# Patient Record
Sex: Female | Born: 1985 | Hispanic: Yes | Marital: Married | Smoking: Never smoker
Health system: Southern US, Community
[De-identification: ages and names within clinical notes are randomized; demographics above are authoritative.]

## PROBLEM LIST (undated history)

## (undated) ENCOUNTER — Inpatient Hospital Stay (HOSPITAL_COMMUNITY): Payer: Self-pay

## (undated) DIAGNOSIS — R519 Headache, unspecified: Secondary | ICD-10-CM

## (undated) DIAGNOSIS — O24419 Gestational diabetes mellitus in pregnancy, unspecified control: Secondary | ICD-10-CM

## (undated) DIAGNOSIS — R51 Headache: Secondary | ICD-10-CM

## (undated) HISTORY — PX: NO PAST SURGERIES: SHX2092

## (undated) HISTORY — DX: Gestational diabetes mellitus in pregnancy, unspecified control: O24.419

---

## 2012-05-23 NOTE — L&D Delivery Note (Signed)
Delivery Note At 3:23 PM a viable and healthy female was delivered via Vaginal, Spontaneous Delivery (Presentation: asynclitic rotated to Right Occiput Anterior, ).  Shoulders deliver easily.  APGAR: 8, 9; weight pending.    Placenta status: Intact, Spontaneous.  Cord: 3 vessels with the following complications: None.  Cord pH: n/a  Anesthesia: Epidural  Episiotomy: None Lacerations: 2nd degree; digital exam of rectum > intact.   Suture Repair: 3.0 vicryl rapide Est. Blood Loss (mL): 200  Mom to postpartum.  Baby to Couplet care / Skin to Skin.  [facility] 05/22/2013, 3:57 PM

## 2013-02-14 LAB — OB RESULTS CONSOLE RUBELLA ANTIBODY, IGM: Rubella: IMMUNE

## 2013-02-14 LAB — OB RESULTS CONSOLE GC/CHLAMYDIA
Chlamydia: NEGATIVE
Gonorrhea: NEGATIVE

## 2013-02-14 LAB — OB RESULTS CONSOLE ANTIBODY SCREEN: Antibody Screen: NEGATIVE

## 2013-02-14 LAB — OB RESULTS CONSOLE RPR: RPR: NONREACTIVE

## 2013-02-14 LAB — OB RESULTS CONSOLE ABO/RH

## 2013-05-21 ENCOUNTER — Inpatient Hospital Stay (HOSPITAL_COMMUNITY): Payer: Medicaid Other | Admitting: Anesthesiology

## 2013-05-21 ENCOUNTER — Inpatient Hospital Stay (HOSPITAL_COMMUNITY)
Admission: AD | Admit: 2013-05-21 | Discharge: 2013-05-23 | DRG: 775 | Disposition: A | Discharge status: Home / Self Care | Payer: Medicaid Other | Source: Ambulatory Visit | Attending: Obstetrics & Gynecology | Admitting: Obstetrics & Gynecology

## 2013-05-21 ENCOUNTER — Encounter (HOSPITAL_COMMUNITY): Payer: Self-pay | Admitting: *Deleted

## 2013-05-21 ENCOUNTER — Encounter (HOSPITAL_COMMUNITY): Payer: Medicaid Other | Admitting: Anesthesiology

## 2013-05-21 DIAGNOSIS — O4290 Premature rupture of membranes, unspecified as to length of time between rupture and onset of labor, unspecified weeks of gestation: Principal | ICD-10-CM | POA: Diagnosis present

## 2013-05-21 LAB — CBC
HCT: 38.3 % (ref 36.0–46.0)
Hemoglobin: 13.2 g/dL (ref 12.0–15.0)
MCH: 29 pg (ref 26.0–34.0)
MCHC: 34.5 g/dL (ref 30.0–36.0)
MCV: 84.2 fL (ref 78.0–100.0)
RDW: 13.5 % (ref 11.5–15.5)
WBC: 10.7 10*3/uL — ABNORMAL HIGH (ref 4.0–10.5)

## 2013-05-21 LAB — GROUP B STREP BY PCR: Group B strep by PCR: NEGATIVE

## 2013-05-21 LAB — TYPE AND SCREEN
ABO/RH(D): A POS
Antibody Screen: NEGATIVE

## 2013-05-21 LAB — OB RESULTS CONSOLE GBS: GBS: NEGATIVE

## 2013-05-21 LAB — POCT FERN TEST: POCT Fern Test: POSITIVE

## 2013-05-21 MED ORDER — LACTATED RINGERS IV SOLN: 500.0000 mL | INTRAVENOUS | Status: DC | PRN

## 2013-05-21 MED ORDER — FENTANYL 2.5 MCG/ML BUPIVACAINE 1/10 % EPIDURAL INFUSION (WH - ANES)
14.0000 mL/h | INTRAMUSCULAR | Status: DC | PRN
  Administered 2013-05-22: 14 mL/h via EPIDURAL
  Filled 2013-05-21 (×2): qty 125

## 2013-05-21 MED ORDER — OXYTOCIN BOLUS FROM INFUSION: 500.0000 mL | INTRAVENOUS | Status: DC

## 2013-05-21 MED ORDER — CITRIC ACID-SODIUM CITRATE 334-500 MG/5ML PO SOLN: 30.0000 mL | ORAL | Status: DC | PRN

## 2013-05-21 MED ORDER — PHENYLEPHRINE 40 MCG/ML (10ML) SYRINGE FOR IV PUSH (FOR BLOOD PRESSURE SUPPORT)
80.0000 ug | PREFILLED_SYRINGE | INTRAVENOUS | Status: DC | PRN
  Filled 2013-05-21: qty 10
  Filled 2013-05-21: qty 2

## 2013-05-21 MED ORDER — OXYTOCIN 40 UNITS IN LACTATED RINGERS INFUSION - SIMPLE MED: 62.5000 mL/h | INTRAVENOUS | Status: DC

## 2013-05-21 MED ORDER — ACETAMINOPHEN 325 MG PO TABS: 650.0000 mg | ORAL_TABLET | ORAL | Status: DC | PRN

## 2013-05-21 MED ORDER — LACTATED RINGERS IV SOLN
INTRAVENOUS | Status: DC
  Administered 2013-05-21 – 2013-05-22 (×2): via INTRAVENOUS

## 2013-05-21 MED ORDER — LIDOCAINE HCL (PF) 1 % IJ SOLN
INTRAMUSCULAR | Status: DC | PRN
  Administered 2013-05-21 (×2): 4 mL

## 2013-05-21 MED ORDER — OXYTOCIN 40 UNITS IN LACTATED RINGERS INFUSION - SIMPLE MED
1.0000 m[IU]/min | INTRAVENOUS | Status: DC
  Administered 2013-05-21: 2 m[IU]/min via INTRAVENOUS
  Filled 2013-05-21: qty 1000

## 2013-05-21 MED ORDER — PHENYLEPHRINE 40 MCG/ML (10ML) SYRINGE FOR IV PUSH (FOR BLOOD PRESSURE SUPPORT)
80.0000 ug | PREFILLED_SYRINGE | INTRAVENOUS | Status: DC | PRN
  Filled 2013-05-21: qty 2

## 2013-05-21 MED ORDER — IBUPROFEN 600 MG PO TABS
600.0000 mg | ORAL_TABLET | Freq: Four times a day (QID) | ORAL | Status: DC | PRN
  Administered 2013-05-22: 600 mg via ORAL
  Filled 2013-05-21: qty 1

## 2013-05-21 MED ORDER — EPHEDRINE 5 MG/ML INJ
10.0000 mg | INTRAVENOUS | Status: DC | PRN
  Filled 2013-05-21: qty 2
  Filled 2013-05-21: qty 4

## 2013-05-21 MED ORDER — FENTANYL CITRATE 0.05 MG/ML IJ SOLN
100.0000 ug | INTRAMUSCULAR | Status: DC | PRN
  Administered 2013-05-21: 100 ug via INTRAVENOUS
  Filled 2013-05-21: qty 2

## 2013-05-21 MED ORDER — TERBUTALINE SULFATE 1 MG/ML IJ SOLN: 0.2500 mg | Freq: Once | INTRAMUSCULAR | Status: AC | PRN

## 2013-05-21 MED ORDER — FENTANYL 2.5 MCG/ML BUPIVACAINE 1/10 % EPIDURAL INFUSION (WH - ANES)
INTRAMUSCULAR | Status: DC | PRN
  Administered 2013-05-21: 13 mL/h via EPIDURAL

## 2013-05-21 MED ORDER — OXYCODONE-ACETAMINOPHEN 5-325 MG PO TABS: 1.0000 | ORAL_TABLET | ORAL | Status: DC | PRN

## 2013-05-21 MED ORDER — LIDOCAINE HCL (PF) 1 % IJ SOLN
30.0000 mL | INTRAMUSCULAR | Status: DC | PRN
  Filled 2013-05-21 (×2): qty 30

## 2013-05-21 MED ORDER — ONDANSETRON HCL 4 MG/2ML IJ SOLN
4.0000 mg | Freq: Four times a day (QID) | INTRAMUSCULAR | Status: DC | PRN
  Administered 2013-05-21: 4 mg via INTRAVENOUS
  Filled 2013-05-21: qty 2

## 2013-05-21 MED ORDER — EPHEDRINE 5 MG/ML INJ
10.0000 mg | INTRAVENOUS | Status: DC | PRN
  Filled 2013-05-21: qty 2

## 2013-05-21 MED ORDER — LACTATED RINGERS IV SOLN: 500.0000 mL | Freq: Once | INTRAVENOUS | Status: DC

## 2013-05-21 MED ORDER — DIPHENHYDRAMINE HCL 50 MG/ML IJ SOLN: 12.5000 mg | INTRAMUSCULAR | Status: DC | PRN

## 2013-05-21 NOTE — Anesthesia Procedure Notes (Signed)
Epidural Patient location during procedure: OB Start time: 05/21/2013 11:20 PM  Staffing Anesthesiologist: Seraphine Gudiel A. Performed by: anesthesiologist   Preanesthetic Checklist Completed: patient identified, site marked, surgical consent, pre-op evaluation, timeout performed, IV checked, risks and benefits discussed and monitors and equipment checked  Epidural Patient position: sitting Prep: site prepped and draped and DuraPrep Patient monitoring: continuous pulse ox and blood pressure Approach: midline Injection technique: LOR air  Needle:  Needle type: Tuohy  Needle gauge: 17 G Needle length: 9 cm and 9 Needle insertion depth: 5 cm cm Catheter type: closed end flexible Catheter size: 19 Gauge Catheter at skin depth: 10 cm Test dose: negative and Other  Assessment Events: blood not aspirated, injection not painful, no injection resistance, negative IV test and no paresthesia  Additional Notes Patient identified. Risks and benefits discussed including failed block, incomplete  Pain control, post dural puncture headache, nerve damage, paralysis, blood pressure Changes, nausea, vomiting, reactions to medications-both toxic and allergic and post Partum back pain. All questions were answered. Patient expressed understanding and wished to proceed. Sterile technique was used throughout procedure. Epidural site was Dressed with sterile barrier dressing. No paresthesias, signs of intravascular injection Or signs of intrathecal spread were encountered.  Patient was more comfortable after the epidural was dosed. Please see RN's note for documentation of vital signs and FHR which are stable.

## 2013-05-21 NOTE — H&P (Signed)
Deanna Nguyen is a 27 y.o. female presenting for SROM 2 hours ago. Maternal Medical History:  Reason for admission: Rupture of membranes.   Fetal activity: Perceived fetal activity is normal.   Last perceived fetal movement was within the past hour.    Prenatal complications: no prenatal complications Prenatal Complications - Diabetes: none.    OB History   Grav Para Term Preterm Abortions TAB SAB Ect Mult Living   1              History reviewed. No pertinent past medical history. History reviewed. No pertinent past surgical history. Family History: family history includes Hypertension in her father, maternal grandfather, maternal grandmother, mother, paternal grandfather, and paternal grandmother. Social History:  reports that she has never smoked. She does not have any smokeless tobacco history on file. She reports that she does not drink alcohol. Her drug history is not on file.   Prenatal Transfer Tool  Maternal Diabetes: No Genetic Screening: Declined Maternal Ultrasounds/Referrals: Normal Fetal Ultrasounds or other Referrals:  None Maternal Substance Abuse:  No Significant Maternal Medications:  None Significant Maternal Lab Results:  None Other Comments:  None  Review of Systems  All other systems reviewed and are negative.      Blood pressure 134/89, pulse 100, temperature 98.1 F (36.7 C), temperature source Oral, resp. rate 16, height 5\' 1"  (1.549 m), weight 68.04 kg (150 lb). Maternal Exam:  Uterine Assessment: Contraction strength is mild.  Contraction duration is 45 seconds. Contraction frequency is irregular.   Abdomen: Fundal height is term.   Estimated fetal weight is 7#8oz.   Fetal presentation: vertex  Introitus: Ferning test: positive.  Amniotic fluid character: meconium stained.     Fetal Exam Fetal Monitor Review: Mode: hand-held doppler probe.   Baseline rate: 120-130.  Variability: moderate (6-25 bpm).   Pattern: accelerations present  and no decelerations.    Fetal State Assessment: Category I - tracings are normal.     Physical Exam  Constitutional: She is oriented to person, place, and time. She appears well-developed and well-nourished.  Respiratory: Effort normal.  GI: Soft. Bowel sounds are normal. She exhibits no distension and no mass. There is no tenderness. There is no rebound and no guarding.  Musculoskeletal: Normal range of motion.  Neurological: She is alert and oriented to person, place, and time.  Skin: Skin is warm and dry.  Psychiatric: She has a normal mood and affect. Her behavior is normal. Judgment and thought content normal.    Prenatal labs: ABO, Rh:  A+ Antibody:  neg Rubella:  immune RPR:   neg HBsAg:   Neg HIV:   Neg GBS:   Unknown  Assessment/Plan: 1.  G1 at 38.4wks 2.  Inadequate prenatal care 3.  GBS unknown 4.  PROM 5.  Meconium stained fluid  Admit, will get GBS.  If + will treat.  Patient would like to see if goes into labor on own.  If no change in contraction pattern in 6-12 hours, will start induction for PROM.  Anticipates breastfeeding.  Teaching service for peds.  STINSON, JACOB JEHIEL 05/21/2013, 12:08 PM

## 2013-05-21 NOTE — Anesthesia Preprocedure Evaluation (Signed)
Anesthesia Evaluation  Patient identified by MRN, date of birth, ID band Patient awake    Reviewed: Allergy & Precautions, H&P , Patient's Chart, lab work & pertinent test results  Airway Mallampati: III TM Distance: >3 FB Neck ROM: full    Dental no notable dental hx. (+) Teeth Intact   Pulmonary neg pulmonary ROS,  breath sounds clear to auscultation  Pulmonary exam normal       Cardiovascular negative cardio ROS  Rhythm:regular Rate:Normal     Neuro/Psych negative neurological ROS  negative psych ROS   GI/Hepatic negative GI ROS, Neg liver ROS,   Endo/Other  negative endocrine ROS  Renal/GU negative Renal ROS  negative genitourinary   Musculoskeletal   Abdominal Normal abdominal exam  (+)   Peds  Hematology negative hematology ROS (+)   Anesthesia Other Findings   Reproductive/Obstetrics (+) Pregnancy PROM                           Anesthesia Physical Anesthesia Plan  ASA: II  Anesthesia Plan: Epidural   Post-op Pain Management:    Induction:   Airway Management Planned:   Additional Equipment:   Intra-op Plan:   Post-operative Plan:   Informed Consent: I have reviewed the patients History and Physical, chart, labs and discussed the procedure including the risks, benefits and alternatives for the proposed anesthesia with the patient or authorized representative who has indicated his/her understanding and acceptance.     Plan Discussed with: Anesthesiologist  Anesthesia Plan Comments:         Anesthesia Quick Evaluation

## 2013-05-21 NOTE — MAU Note (Addendum)
First preg, due Jan 9; about an hour ago started leaking, first there was blood then greenish fluid; fluid continues- greenish in color.  Was living in [place], got care there until 2 months ago

## 2013-05-22 ENCOUNTER — Encounter (HOSPITAL_COMMUNITY): Payer: Self-pay | Admitting: *Deleted

## 2013-05-22 DIAGNOSIS — O4290 Premature rupture of membranes, unspecified as to length of time between rupture and onset of labor, unspecified weeks of gestation: Secondary | ICD-10-CM

## 2013-05-22 MED ORDER — ONDANSETRON HCL 4 MG/2ML IJ SOLN: 4.0000 mg | INTRAMUSCULAR | Status: DC | PRN

## 2013-05-22 MED ORDER — ONDANSETRON HCL 4 MG PO TABS: 4.0000 mg | ORAL_TABLET | ORAL | Status: DC | PRN

## 2013-05-22 MED ORDER — DIBUCAINE 1 % RE OINT: 1.0000 "application " | TOPICAL_OINTMENT | RECTAL | Status: DC | PRN

## 2013-05-22 MED ORDER — SENNOSIDES-DOCUSATE SODIUM 8.6-50 MG PO TABS
2.0000 | ORAL_TABLET | ORAL | Status: DC
  Administered 2013-05-23: 2 via ORAL
  Filled 2013-05-22: qty 2

## 2013-05-22 MED ORDER — WITCH HAZEL-GLYCERIN EX PADS
1.0000 "application " | MEDICATED_PAD | CUTANEOUS | Status: DC | PRN
  Administered 2013-05-22: 1 via TOPICAL

## 2013-05-22 MED ORDER — DIPHENHYDRAMINE HCL 25 MG PO CAPS: 25.0000 mg | ORAL_CAPSULE | Freq: Four times a day (QID) | ORAL | Status: DC | PRN

## 2013-05-22 MED ORDER — IBUPROFEN 600 MG PO TABS
600.0000 mg | ORAL_TABLET | Freq: Four times a day (QID) | ORAL | Status: DC
  Administered 2013-05-23 (×4): 600 mg via ORAL
  Filled 2013-05-22 (×5): qty 1

## 2013-05-22 MED ORDER — SIMETHICONE 80 MG PO CHEW: 80.0000 mg | CHEWABLE_TABLET | ORAL | Status: DC | PRN

## 2013-05-22 MED ORDER — OXYCODONE-ACETAMINOPHEN 5-325 MG PO TABS: 1.0000 | ORAL_TABLET | ORAL | Status: DC | PRN

## 2013-05-22 MED ORDER — TETANUS-DIPHTH-ACELL PERTUSSIS 5-2.5-18.5 LF-MCG/0.5 IM SUSP: 0.5000 mL | Freq: Once | INTRAMUSCULAR | Status: DC

## 2013-05-22 MED ORDER — LANOLIN HYDROUS EX OINT: TOPICAL_OINTMENT | CUTANEOUS | Status: DC | PRN

## 2013-05-22 MED ORDER — ZOLPIDEM TARTRATE 5 MG PO TABS: 5.0000 mg | ORAL_TABLET | Freq: Every evening | ORAL | Status: DC | PRN

## 2013-05-22 MED ORDER — PRENATAL MULTIVITAMIN CH
1.0000 | ORAL_TABLET | Freq: Every day | ORAL | Status: DC
  Administered 2013-05-23: 1 via ORAL
  Filled 2013-05-22: qty 1

## 2013-05-22 MED ORDER — BENZOCAINE-MENTHOL 20-0.5 % EX AERO
1.0000 "application " | INHALATION_SPRAY | CUTANEOUS | Status: DC | PRN
  Administered 2013-05-22: 1 via TOPICAL
  Filled 2013-05-22: qty 56

## 2013-05-22 NOTE — Progress Notes (Signed)
Dominiqua Cooner is a 27 y.o. G1P0 at [redacted]w[redacted]d admitted for PROM, augmented with Pitocin.  Subjective: Pt comfortable with epidural, reports some intermittent vaginal pressure.   Objective: BP 113/69  Pulse 75  Temp(Src) 98.5 F (36.9 C) (Oral)  Resp 18  Ht 5\' 1"  (1.549 m)  Wt 68.04 kg (150 lb)  BMI 28.36 kg/m2  SpO2 100%   Total I/O In: -  Out: 2600 [Urine:2600]  FHT:  FHR: 135 bpm, variability: moderate,  accelerations:  Present,  decelerations:  Absent UC:   regular, every 3 minutes SVE:   Dilation: 7 Effacement (%): 100 Station: -2 Exam by:: Leftwich Kirby CNM AROM of forebag at this time.  Labs: Lab Results  Component Value Date   WBC 10.7* 05/21/2013   HGB 13.2 05/21/2013   HCT 38.3 05/21/2013   MCV 84.2 05/21/2013   PLT 157 05/21/2013    Assessment / Plan: Augmentation of labor, progressing well  Labor: Progressing normally Preeclampsia:  N/A Fetal Wellbeing:  Category I Pain Control:  Epidural I/D:  n/a Anticipated MOD:  NSVD  LEFTWICH-KIRBY, Fawaz Borquez 05/22/2013, 4:08 AM

## 2013-05-22 NOTE — Progress Notes (Signed)
Arrion Burruel is a 27 y.o. G1P0 at [redacted]w[redacted]d admitted for PROM. Augmentation with Pitocin started after no cervical change.  Subjective:   Objective: BP 115/82  Pulse 80  Temp(Src) 98.2 F (36.8 C) (Oral)  Resp 20  Ht 5\' 1"  (1.549 m)  Wt 68.04 kg (150 lb)  BMI 28.36 kg/m2  SpO2 100%      FHT:  FHR: 135 bpm, variability: moderate,  accelerations:  Present,  decelerations:  Present early UC:   regular, every 3 minutes SVE:   Dilation: 4.5 Effacement (%): 80 Station: -2 Exam by:: h stone rnc  Labs: Lab Results  Component Value Date   WBC 10.7* 05/21/2013   HGB 13.2 05/21/2013   HCT 38.3 05/21/2013   MCV 84.2 05/21/2013   PLT 157 05/21/2013    Assessment / Plan: Augmentation of labor, progressing well  Labor: Progressing normally Preeclampsia:  N/A Fetal Wellbeing:  Category I Pain Control:  Epidural I/D:  n/a Anticipated MOD:  NSVD  LEFTWICH-KIRBY, Zekiah Coen 05/22/2013, 12:30 AM

## 2013-05-23 LAB — CBC
HCT: 28.6 % — ABNORMAL LOW (ref 36.0–46.0)
Hemoglobin: 10 g/dL — ABNORMAL LOW (ref 12.0–15.0)
MCH: 29.6 pg (ref 26.0–34.0)
MCHC: 35 g/dL (ref 30.0–36.0)
MCV: 84.6 fL (ref 78.0–100.0)
Platelets: 149 10*3/uL — ABNORMAL LOW (ref 150–400)
RBC: 3.38 MIL/uL — ABNORMAL LOW (ref 3.87–5.11)
RDW: 13.7 % (ref 11.5–15.5)
WBC: 15.8 10*3/uL — ABNORMAL HIGH (ref 4.0–10.5)

## 2013-05-23 MED ORDER — IBUPROFEN 600 MG PO TABS: 600.0000 mg | ORAL_TABLET | Freq: Four times a day (QID) | ORAL | Status: DC

## 2013-05-23 NOTE — Discharge Summary (Signed)
Obstetric Discharge Summary Reason for Admission: rupture of membranes Prenatal Procedures: ultrasound Intrapartum Procedures: spontaneous vaginal delivery Postpartum Procedures: none Complications-Operative and Postpartum: none Hemoglobin  Date Value Range Status  05/23/2013 10.0* 12.0 - 15.0 g/dL Final     DELTA CHECK NOTED     REPEATED TO VERIFY     HCT  Date Value Range Status  05/23/2013 28.6* 36.0 - 46.0 % Final    Physical Exam:  General: alert, cooperative and no distress Lochia: appropriate Uterine Fundus: firm Incision: N/A DVT Evaluation: No evidence of DVT seen on physical exam. Negative Homan's sign. No cords or calf tenderness. No significant calf/ankle edema.  Discharge Diagnoses: Term Pregnancy-delivered  Discharge Information: Date: 05/23/2013 Activity: pelvic rest Diet: routine Medications: Ibuprofen Condition: stable Instructions: refer to practice specific booklet Discharge to: home Follow-up Information   Schedule an appointment as soon as possible for [facility] with [facility].   Specialty:  Obstetrics and Gynecology   Contact information:   [address] [place] [id number] [phone]      Newborn Data: Live born female  Birth Weight: 7 lb 10 oz (3459 g) APGAR: 8, 9  Home with mother.  Deanna Nguyen, Adarrius Graeff 05/23/2013, 11:49 AM

## 2013-05-23 NOTE — Discharge Summary (Signed)

## 2013-05-23 NOTE — Anesthesia Postprocedure Evaluation (Signed)
Anesthesia Post Note  Patient: Deanna Nguyen  Procedure(s) Performed: * No procedures listed *  Anesthesia type: Epidural  Patient location: Mother/Baby  Post pain: Pain level controlled  Post assessment: Post-op Vital signs reviewed  Last Vitals:  Filed Vitals:   05/23/13 0636  BP: 114/76  Pulse: 89  Temp: 37 C  Resp: 16    Post vital signs: Reviewed  Level of consciousness:alert  Complications: No apparent anesthesia complications

## 2013-05-23 NOTE — L&D Delivery Note (Signed)
Attestation of Attending Supervision of Advanced Practitioner (CNM/NP): Evaluation and management procedures were performed by the Advanced Practitioner under my supervision and collaboration. I have reviewed the Advanced Practitioner's note and chart, and I agree with the management and plan.  [name] 4:21 PM

## 2013-05-23 NOTE — Lactation Note (Signed)
This note was copied from the chart of Deanna Nguyen. Lactation Consultation Note  Patient Name: Deanna Nguyen ZOXWR'UToday's Date: 05/23/2013 Reason for consult: Follow-up assessment Mom reports baby not latching/nursing well. Mom has flat nipples. Baby jittery. Demonstrated waking techniques and need to undress baby to nurse better. Assisted mom with hand expression and hand pump, not able to express any colostrum. Fitted mom with a #20 NS and also gave a #24 if needed later. Inserted Lucien MonsGerber Good Start into NS with syringe and assisted mom with football hold. Baby able to take 15ml of formula via the NS. Mom enc to look for feeding cues. Enc to massage breast, hand express, then feed baby using NS. Given feeding chart for amounts by baby's age in hours. Set mom up with a DEBP, shown how to use and clean. Enc to post-pump for 10-15 minutes after each feeding and to wear breast shells between feedings.   Maternal Data    Feeding Feeding Type: Formula  LATCH Score/Interventions Latch: Repeated attempts needed to sustain latch, nipple held in mouth throughout feeding, stimulation needed to elicit sucking reflex. (Using #20 NS.) Intervention(s): Skin to skin;Waking techniques Intervention(s): Assist with latch  Audible Swallowing: Spontaneous and intermittent  Type of Nipple: Flat Intervention(s):  (Nipple shield and syringe.)  Comfort (Breast/Nipple): Soft / non-tender     Hold (Positioning): Assistance needed to correctly position infant at breast and maintain latch. Intervention(s): Breastfeeding basics reviewed  LATCH Score: 7  Lactation Tools Discussed/Used Tools: Shells;Nipple Dorris CarnesShields;Pump Nipple shield size: 20;24 Shell Type: Inverted Breast pump type: Double-Electric Breast Pump WIC Program: Yes Pump Review: Setup, frequency, and cleaning Initiated by:: JW Date initiated:: 05/23/13   Consult Status Consult Status: Follow-up Date: 05/24/13 Follow-up type:  In-patient    Geralynn OchsWILLIARD, Arlicia Paquette 05/23/2013, 4:30 PM

## 2013-05-23 NOTE — Discharge Instructions (Signed)
Vaginal Delivery °Care After °Refer to this sheet in the next few weeks. These discharge instructions provide you with information on caring for yourself after delivery. Your caregiver may also give you specific instructions. Your treatment has been planned according to the most current medical practices available, but problems sometimes occur. Call your caregiver if you have any problems or questions after you go home. °HOME CARE INSTRUCTIONS °· Take over-the-counter or prescription medicines only as directed by your caregiver or pharmacist. °· Do not drink alcohol, especially if you are breastfeeding or taking medicine to relieve pain. °· Do not chew or smoke tobacco. °· Do not use illegal drugs. °· Continue to use good perineal care. Good perineal care includes: °· Wiping your perineum from front to back. °· Keeping your perineum clean. °· Do not use tampons or douche until your caregiver says it is okay. °· Shower, wash your hair, and take tub baths as directed by your caregiver. °· Wear a well-fitting bra that provides breast support. °· Eat healthy foods. °· Drink enough fluids to keep your urine clear or pale yellow. °· Eat high-fiber foods such as whole grain cereals and breads, brown rice, beans, and fresh fruits and vegetables every day. These foods may help prevent or relieve constipation. °· Follow your cargiver's recommendations regarding resumption of activities such as climbing stairs, driving, lifting, exercising, or traveling. °· Talk to your caregiver about resuming sexual activities. Resumption of sexual activities is dependent upon your risk of infection, your rate of healing, and your comfort and desire to resume sexual activity. °· Try to have someone help you with your household activities and your newborn for at least a few days after you leave the hospital. °· Rest as much as possible. Try to rest or take a nap when your newborn is sleeping. °· Increase your activities gradually. °· Keep all  of your scheduled postpartum appointments. It is very important to keep your scheduled follow-up appointments. At these appointments, your caregiver will be checking to make sure that you are healing physically and emotionally. °SEEK MEDICAL CARE IF:  °· You are passing large clots from your vagina. Save any clots to show your caregiver. °· You have a foul smelling discharge from your vagina. °· You have trouble urinating. °· You are urinating frequently. °· You have pain when you urinate. °· You have a change in your bowel movements. °· You have increasing redness, pain, or swelling near your vaginal incision (episiotomy) or vaginal tear. °· You have pus draining from your episiotomy or vaginal tear. °· Your episiotomy or vaginal tear is separating. °· You have painful, hard, or reddened breasts. °· You have a severe headache. °· You have blurred vision or see spots. °· You feel sad or depressed. °· You have thoughts of hurting yourself or your newborn. °· You have questions about your care, the care of your newborn, or medicines. °· You are dizzy or lightheaded. °· You have a rash. °· You have nausea or vomiting. °· You were breastfeeding and have not had a menstrual period within 12 weeks after you stopped breastfeeding. °· You are not breastfeeding and have not had a menstrual period by the 12th week after delivery. °· You have a fever. °SEEK IMMEDIATE MEDICAL CARE IF:  °· You have persistent pain. °· You have chest pain. °· You have shortness of breath. °· You faint. °· You have leg pain. °· You have stomach pain. °· Your vaginal bleeding saturates two or more sanitary pads   in 1 hour. MAKE SURE YOU:   Understand these instructions.  Will watch your condition.  Will get help right away if you are not doing well or get worse. Document Released: 05/06/2000 Document Revised: 02/01/2012 Document Reviewed: 01/04/2012 [facility] Patient Information 2014 [place], [place]. Breastfeeding Challenges and  Solutions Even though breastfeeding is natural, it can be challenging, especially in the first few weeks after childbirth. It is normal for problems to arise when starting to breastfeed your new baby, even if you have breastfed before. This document provides some solutions to the most common breastfeeding challenges.  CHALLENGES AND SOLUTIONS Challenge Cracked or Sore Nipples Cracked or sore nipples are commonly experienced by breastfeeding mothers. Cracked or sore nipples often are caused by inadequate latching (when your baby's mouth attaches to your breast to breastfeed). Soreness can also happen if your baby is not positioned properly at your breast. Although nipple cracking and soreness are common during the first week after birth, nipple pain is never normal. If you experience nipple cracking or soreness that lasts longer than 1 week or nipple pain, call your health care provider or lactation consultant.  Solution Ensure proper latching and positioning of your baby by following the steps below:  Find a comfortable place to sit or lie down, with your neck and back well supported.  Place a pillow or rolled up blanket under your baby to bring him or her to the level of your breast (if you are seated).  Make sure that your baby's abdomen is facing your abdomen.  Gently massage your breast. With your fingertips, massage from your chest wall toward your nipple in a circular motion. This encourages milk flow. You may need to continue this action during the feeding if your milk flows slowly.  Support your breast with 4 fingers underneath and your thumb above your nipple. Make sure your fingers are well away from your nipple and your baby's mouth.  Stroke your baby's lips gently with your finger or nipple.  When your baby's mouth is open wide enough, quickly bring your baby to your breast, placing your entire nipple and as much of the colored area around your nipple (areola) as possible into your  baby's mouth.  More areola should be visible above your baby's upper lip than below the lower lip.  Your baby's tongue should be between his or her lower gum and your breast.  Ensure that your baby's mouth is correctly positioned around your nipple (latched). Your baby's lips should create a seal on your breast and be turned out (everted).  It is common for your baby to suck for about 2 3 minutes in order to start the flow of breast milk. Signs that your baby has successfully latched on to your nipple include:   Quietly tugging or quietly sucking without causing you pain.   Swallowing heard between every 3 4 sucks.   Muscle movement above and in front of his or her ears with sucking.  Signs that your baby has not successfully latched on to nipple include:   Sucking sounds or smacking sounds from your baby while nursing.   Nipple pain.  Ensure that your breasts stay moisturized and healthy by:  Avoiding the use of soap on your nipples.   Wearing a supportive bra. Avoid wearing under wire style bras or tight bras.   Air drying your nipples for 3 4 minutes after each feeding.   Using only cotton bra pads to absorb breast milk leakage. Leaking of breast milk  between feedings is normal. Be sure to change the pads if they become soaked with milk.  Using lanolin on your nipples after nursing. Lanolin helps to maintain your skin's normal moisture barrier. If you use pure lanolin you do not need to wash it off before feeding your baby again. Pure lanolin is not toxic to your baby. You may also hand express a few drops of breast milk and gently massage that milk into your nipples, allowing it to air dry. Challenge Breast Engorgement Breast engorgement is the overfilling of your breasts with breast milk. In the first few weeks after giving birth, you may experience breast engorgement. Breast engorgement can make your breasts throb and feel hard, tightly stretched, warm, and tender.  Engorgement peaks about the fifth day after you give birth. Having breast engorgement does not mean you have to stop breastfeeding your baby. Solution  Breastfeed when you feel the need to reduce the fullness of your breasts or when your baby shows signs of hunger. This is called "breastfeeding on demand."  Newborns (babies younger than 4 weeks) often breastfeed every 1 3 hours during the day. You may need to awaken your baby to feed if he or she is asleep at a feeding time.  Do not allow your baby to sleep longer than 5 hours during the night without a feeding.  Pump or hand express breast milk before breastfeeding to soften your breast, areola, and nipple.  Apply warm, moist heat (in the shower or with warm water-soaked hand towels) just before feeding or pumping, or massage your breast before or during breastfeeding. This increases circulation and helps your milk to flow.  Completely empty your breasts when breastfeeding or pumping. Afterward, wear a snug bra (nursing or regular) or tank top for 1 2 days to signal your body to slightly decrease milk production. Only wear snug bras or tank tops to treat engorgement. Tight bras typically should be avoided by breastfeeding mothers. Once engorgement is relieved, return to wearing regular, loose-fitting clothes.  Apply ice packs to your breasts to lessen the pain from engorgement and relieve swelling, unless the ice is uncomfortable for you.  Do not delay feedings. Try to relax when it is time to feed your baby. This helps to trigger your "let-down reflex," which releases milk from your breast.  Ensure your baby is latched on to your breast and positioned properly while breastfeeding.  Allow your baby to remain at your breast as long as he or she is latched on well and actively sucking. Your baby will let you know when he or she is done breastfeeding by pulling away from your breast or falling asleep.  Avoid introducing bottles or pacifiers to  your baby in the early weeks of breastfeeding. Wait to introduce these things until after resolving any breastfeeding challenges.  Try to pump your milk on the same schedule as when your baby would breastfeed if you are returning to work or away from home for an extended period.  Drink plenty of fluids to avoid dehydration, which can eventually put you at greater risk of breast engorgement. If you follow these suggestions, your engorgement should improve in 24 48 hours. If you are still experiencing difficulty, call your lactation consultant or health care provider.  Challenge Plugged Milk Ducts Plugged milk ducts occur when the duct does not drain milk effectively and becomes swollen. Wearing a tight-fitting nursing bra or having difficulty with latching may cause plugged milk ducts. Not drinking enough water (8 10  c [1.9 2.4 L] per day) can contribute to plugged milk ducts. Once a duct has become plugged, hard lumps, soreness, and redness may develop in your breast.  Solution Do not delay feedings. Feed your baby frequently and try to empty your breasts of milk at each feeding. Try breastfeeding from the affected side first so there is a better chance that the milk will drain completely from that breast. Apply warm, moist towels to your breasts for 5 10 minutes before feeding. Alternatively, a hot shower right before breastfeeding can provide the moist heat that can encourage milk flow. Gentle massage of the sore area before and during a feeding may also help. Avoid wearing tight clothing or bras that put pressure on your breasts. Wear bras that offer good support to your breasts, but avoid underwire bras. If you have a plugged milk duct and develop a fever, you need to see your health care provider.  Challenge Mastitis Mastitis is inflammation of your breast. It usually is caused by a bacterial infection and can cause flu-like symptoms. You may develop redness in your breast and a fever. Often when  mastitis occurs, your breast becomes firm, warm, and very painful. The most common causes of mastitis are poor latching, ineffective sucking from your baby, consistent pressure on your breast (possibly from wearing a tight-fitting bra or shirt that restricts the milk flow), unusual stress or fatigue, or missed feedings.  Solution You will be given antibiotic medicine to treat the infection. It is still important to breastfeed frequently to empty your breasts. Continuing to breastfeed while you recover from mastitis will not harm your baby. Make sure your baby is positioned properly during every feeding. Apply moist heat to your breasts for a few minutes before feeding to help the milk flow and to help your breasts empty more easily. Challenge Camelia Phenes is a yeast infection that can form on your nipples, in your breast, or in your baby's mouth. It causes itching, soreness, burning or stabbing pain, and sometimes a rash.  Solution You will be given a medicated ointment for your nipples, and your baby will be given a liquid medicine for his or her mouth. It is important that you and your baby are treated at the same time because thrush can be passed between you and your baby. Change disposable nursing pads often. Any bras, towels, or clothing that come in contact with infected areas of your body or your baby's body need to be washed in very hot water every day. Wash your hands and your baby's hands often. All pacifiers, bottle nipples, or toys your baby puts in his or her mouth should be boiled once a day for 20 minutes. After 1 week of treatment, discard pacifiers and bottle nipples and buy new ones. All breast pump parts that touch the milk need to be boiled for 20 minutes every day. Challenge Low Milk Supply You may not be producing enough milk if your baby is not gaining the proper amount of weight. Breast milk production is based on a supply-and-demand system. Your milk supply depends on how frequently  and effectively your baby empties your breast. Solution The more you breastfeed and pump, the more breast milk you will produce. It is important that your baby empties at least one of your breasts at each feeding. If this is not happening, then use a breast pump or hand express any milk that remains. This will help to drain as much milk as possible at each feeding. It  will also signal your body to produce more milk. If your baby is not emptying your breasts, it may be due to latching, sucking, or positioning problems. If low milk supply continues after addressing these issues, contact your health care provider or a lactation specialist as soon as possible. Challenge Inverted or Flat Nipples Some women have nipples that turn inward instead of protruding outward. Other women have nipples that are flat. Inverted or flat nipples can sometimes make it more difficult for your baby to latch onto your breast. Solution You may be given a small device that pulls out inverted nipples. This device should be applied right before your baby is brought to your breast. You can also try using a breast pump for a short time before placing the baby at your breast. The pump can pull your nipple outwards to help your infant latch more easily. The baby's sucking motion will help the inverted nipple protrude as well.  If you have flat nipples, encourage your baby to latch onto your breast and feed frequently in the early days after birth. This will give your baby practice latching on correctly while your breast is still soft. When your milk supply increases, between the second and fifth day after birth and your breasts become full, your baby will have an easier time latching.  Contact a lactation consultant if you still have concerns. She or he can teach you additional techniques to address breastfeeding problems related to nipple shape and position.  FOR MORE INFORMATION [employer]: www.llli.org Document  Released: 10/31/2005 Document Revised: 01/09/2013 Document Reviewed: 11/02/2012 [facility] Patient Information 2014 [place], [place].

## 2013-05-24 ENCOUNTER — Ambulatory Visit: Payer: Self-pay

## 2013-05-24 NOTE — Lactation Note (Addendum)
This note was copied from the chart of Deanna Nguyen. Lactation Consultation Note  Patient Name: Deanna Nguyen [id number] Date: 05/24/2013 Reason for consult: Follow-up assessment Parents report that they decided to bottle feed the baby through the night. Mom intends to pump and bottle feed for now. Assisted mom to set up pump. Assessed mom's need for size increase from #24 to #27 flange due to base of nipple being too restricted. Per mom, #27 felt more comfortable and appeared to give the nipple more needed space. Reviewed engorgement prevention/treatment. Per Dr. Nigel Bridgeman, baby going home on double phototherapy. Contacted case Freight forwarder for lights. Also contacted [facility] for patient via phone message and faxed form as well. WIC response is pending. Reviewed Baby and Me booklet for milk storage. Parents aware of OP/BFSG services. Reminded patients to take pump kit with them to [facility].  Maternal Data    Feeding Feeding Type:  (Baby receiving bottles, mom pumping/bottle feeding.)  LATCH Score/Interventions                      Lactation Tools Discussed/Used     Consult Status Consult Status: Complete    Cabela Pacifico 05/24/2013, 2:06 PM

## 2013-05-24 NOTE — Lactation Note (Signed)
This note was copied from the chart of Deanna Nguyen. Lactation Consultation Note  Patient Name: Deanna Nguyen ZOXWR'UToday's Date: 05/24/2013 Reason for consult: [facility]. $30.00 received. Parents aware loaner needs to be returned within the 7 days.  Maternal Data    Feeding Feeding Type:  (Baby receiving bottles, mom pumping/bottle feeding.)  LATCH Score/Interventions                      Lactation Tools Discussed/Used     Consult Status Consult Status: Complete    Geralynn OchsWILLIARD, [name] 05/24/2013, 3:38 PM

## 2013-05-27 NOTE — Progress Notes (Signed)
Ur chart review completed.  

## 2013-07-05 ENCOUNTER — Ambulatory Visit: Payer: Self-pay | Admitting: Family

## 2014-01-31 LAB — OB RESULTS CONSOLE ANTIBODY SCREEN
Antibody Screen: NEGATIVE
Antibody Screen: NEGATIVE

## 2014-01-31 LAB — OB RESULTS CONSOLE ABO/RH
RH TYPE: POSITIVE
RH TYPE: POSITIVE

## 2014-01-31 LAB — OB RESULTS CONSOLE RPR
RPR: NONREACTIVE
RPR: NONREACTIVE

## 2014-01-31 LAB — OB RESULTS CONSOLE HIV ANTIBODY (ROUTINE TESTING)
HIV: NONREACTIVE
HIV: NONREACTIVE

## 2014-01-31 LAB — OB RESULTS CONSOLE GC/CHLAMYDIA
Chlamydia: NEGATIVE
GC PROBE AMP, GENITAL: NEGATIVE

## 2014-01-31 LAB — OB RESULTS CONSOLE RUBELLA ANTIBODY, IGM
RUBELLA: IMMUNE
Rubella: IMMUNE

## 2014-01-31 LAB — OB RESULTS CONSOLE HEPATITIS B SURFACE ANTIGEN
HEP B S AG: NEGATIVE
HEP B S AG: NEGATIVE

## 2014-02-06 LAB — OB RESULTS CONSOLE GC/CHLAMYDIA
Chlamydia: NEGATIVE
Gonorrhea: NEGATIVE

## 2014-03-24 ENCOUNTER — Encounter (HOSPITAL_COMMUNITY): Payer: Self-pay | Admitting: *Deleted

## 2014-05-23 NOTE — L&D Delivery Note (Signed)
Operative Delivery Note At 3:46 AM a viable and healthy female was delivered via Vaginal, Vacuum Investment banker, operational(Extractor).  Presentation: vertex; Position: Left,, Occiput,, Posterior; Station: +2.  Verbal consent: obtained from patient.  Risks and benefits discussed in detail.  Risks include, but are not limited to the risks of anesthesia, bleeding, infection, damage to maternal tissues, fetal cephalhematoma.  There is also the risk of inability to effect vaginal delivery of the head, or shoulder dystocia that cannot be resolved by established maneuvers, leading to the need for emergency cesarean section.  APGAR: 8, 9; weight pending .   Placenta status: Intact, Spontaneous.  Not sent Cord: 3 vessels with the following complications: None.  Cord pH: 7.09  Anesthesia: Epidural  Instruments: mushroom Episiotomy: None Lacerations: 1st degree;Perineal Suture Repair: 3.0 chromic Est. Blood Loss (mL): 250  Mom to postpartum.  Baby to Couplet care / Skin to Skin.  [name] A 07/22/2014, 4:23 AM

## 2014-06-04 ENCOUNTER — Encounter: Payer: 59 | Attending: Obstetrics & Gynecology

## 2014-06-04 VITALS — Ht 61.0 in | Wt 157.7 lb

## 2014-06-04 DIAGNOSIS — Z713 Dietary counseling and surveillance: Secondary | ICD-10-CM | POA: Diagnosis not present

## 2014-06-04 DIAGNOSIS — O24419 Gestational diabetes mellitus in pregnancy, unspecified control: Secondary | ICD-10-CM | POA: Diagnosis not present

## 2014-06-08 NOTE — Progress Notes (Signed)
  Patient was seen on 06/04/13 for Gestational Diabetes self-management . The following learning objectives were met by the patient :   States the definition of Gestational Diabetes  States why dietary management is important in controlling blood glucose  Describes the effects of carbohydrates on blood glucose levels  Demonstrates ability to create a balanced meal plan  Demonstrates carbohydrate counting   States when to check blood glucose levels  Demonstrates proper blood glucose monitoring techniques  States the effect of stress and exercise on blood glucose levels  States the importance of limiting caffeine and abstaining from alcohol and smoking  Plan:  Aim for 2 Carb Choices per meal (30 grams) +/- 1 either way for breakfast Aim for 3 Carb Choices per meal (45 grams) +/- 1 either way from lunch and dinner Aim for 1-2 Carbs per snack Begin reading food labels for Total Carbohydrate and sugar grams of foods Consider  increasing your activity level by walking daily as tolerated Begin checking BG before breakfast and 2 hours after first bit of breakfast, lunch and dinner after  as directed by MD  Take medication  as directed by MD  Blood glucose monitor given: Accu Chek Nano BG Monitoring Kit Lot # [id number] Exp: 04/22/15 Blood glucose reading: 70m/dl  Patient instructed to monitor glucose levels: FBS: 60 - <90 2 hour: <120  Patient received the following handouts:  Nutrition Diabetes and Pregnancy  Carbohydrate Counting List  Meal Planning worksheet  Patient will be seen for follow-up as needed.

## 2014-06-26 LAB — OB RESULTS CONSOLE GBS: GBS: NEGATIVE

## 2014-07-12 ENCOUNTER — Encounter (HOSPITAL_COMMUNITY): Payer: Self-pay

## 2014-07-12 ENCOUNTER — Observation Stay (HOSPITAL_COMMUNITY)
Admission: RE | Admit: 2014-07-12 | Discharge: 2014-07-12 | Disposition: A | Discharge status: Home / Self Care | Payer: 59 | Source: Ambulatory Visit | Attending: Obstetrics & Gynecology | Admitting: Obstetrics & Gynecology

## 2014-07-12 DIAGNOSIS — O328XX0 Maternal care for other malpresentation of fetus, not applicable or unspecified: Principal | ICD-10-CM | POA: Insufficient documentation

## 2014-07-12 DIAGNOSIS — Z3A37 37 weeks gestation of pregnancy: Secondary | ICD-10-CM | POA: Insufficient documentation

## 2014-07-12 DIAGNOSIS — O24419 Gestational diabetes mellitus in pregnancy, unspecified control: Secondary | ICD-10-CM | POA: Diagnosis not present

## 2014-07-12 DIAGNOSIS — IMO0001 Reserved for inherently not codable concepts without codable children: Secondary | ICD-10-CM | POA: Diagnosis present

## 2014-07-12 HISTORY — DX: Headache: R51

## 2014-07-12 HISTORY — DX: Headache, unspecified: R51.9

## 2014-07-12 MED ORDER — LACTATED RINGERS IV SOLN: INTRAVENOUS | Status: DC

## 2014-07-12 MED ORDER — TERBUTALINE SULFATE 1 MG/ML IJ SOLN
0.2500 mg | Freq: Once | INTRAMUSCULAR | Status: AC | PRN
  Administered 2014-07-12: 0.25 mg via SUBCUTANEOUS
  Filled 2014-07-12: qty 1

## 2014-07-12 NOTE — Discharge Summary (Signed)
Physician Discharge Summary  Patient ID: Deanna Nguyen MRN: [id number] DOB/AGE: 12/18/1985 29 y.o.  Admit date: 07/12/2014 Discharge date: 07/12/2014  Admission Diagnoses: 37.4 wks Breech Discharge Diagnoses: 37.4 wks, Cephalic  Procedure: External Cephalic version   Hospital Course: uncomplicated version and post-procedure FHT  Discharge Exam: Blood pressure 123/79, pulse 103, temperature 97 F (36.1 C), temperature source Axillary, resp. rate 18, height 5\' 1"  (1.549 m), weight 156 lb (70.761 kg). Normal exam and FHT category I  Disposition: Home     Medication List    ASK your doctor about these medications        PRENATAL VITAMINS PO  Take 1 each by mouth daily.           Follow-up Information    Follow up with Robley FriesMODY,Neva Ramaswamy R, MD.   Specialty:  Obstetrics and Gynecology   Why:  routine appointment   Contact information:   [address] [place] [address] [phone]       Follow up with Robley FriesMODY,Phillipe Clemon R, MD On 07/17/2014.   Specialty:  Obstetrics and Gynecology   Why:  routine appointment   Contact information:   [address] [place] [id number] [phone]       Signed: Robley FriesMODY,Marston Mccadden R 07/12/2014, 11:41 AM

## 2014-07-12 NOTE — Progress Notes (Signed)
Patient ID: Deanna Nguyen, female   DOB: May 19, 1986, 29 y.o.   MRN: [id number]   07/12/14: PROCEDURE  NOTE:   Procedure: EXTERNAL CEPHALIC VERSION Pre-OP diagnosis: [place] Jim[place]leton pregnancy, Partial footling breech, 37.4 weeks  Post-Op dianosis: Version to Cephalic presentation.  Physician: Shea EvansVaishali Anzley Dibbern, MD Assistant: Marlinda Mikeanya Bailey, CNM  Procedure:  29 yo female with prior term SVD is G2P1001 at 37.4 wks pregnancy with persistent breech position and normal AFI, AGA on growth sono at 35 wks. Patient has A2GDM, overall control not great but most Fastings are elevated in 100s and only few postmeals are in 140s but normal fetal AC on sono.  Patient was counseled on option of external cephalic version vs elective C/section, risks/ benefits/ possible success and she chose to attempt version.  FHT was 150s/ category I, no contractions noted. Sono confirmed position. After obtaining informed written consent for version and emergency C/section, an IV access was secured, OR availability was checked and she was given 0.25mg  Terbutaline subcutaneously.   Head was in RUQ and buttock in suprapubic to left lower pelvic area. Anti-clockwise move was made of the baby by controlled pushing head down to RLQ and buttock up to LUQ. Intermittent FHT check with sono noted normal fetal cardiac activity. Second attempt worked. Baby turned cephalic, spine was central and buttocks up in the fundus. FCA normal.   Post procedure FHT x 1 hr - 150s/ category I, no contractions noted Uterus soft, non tender and relaxed.  No abdominal bruising noted. No c/o bleeding/ leaking fluid.   Procedure completed, no complications. Patient will be discharged home after observation.   V.Dmonte Maher, MD

## 2014-07-12 NOTE — H&P (Signed)
29 yo G3P1011 at 37.4 wks, here for external cephalic version for persistent breech-frank/partial footling breech.  Pt with prior term SVD, 7.6 lbs. This pregnancy with A2GDM but not taking Glyburide control more erratic with it as she gets more hypoglycemia with it and has not started Metformin that was given 3 days back at office visit. Sono normal growth AGA and normal AC, no LGA. AFI 20cm.   Reviewed version with risks/complications and possible emergency C/s today vs planning elective C/s at 39 wks if breech persists. Pt desires version.   BP 122/76 mmHg  Pulse 89  Temp(Src) 98.7 F (37.1 C) (Oral)  Resp 20  Ht 5\' 1"  (1.549 m)  Wt 156 lb (70.761 kg)  BMI 29.49 kg/m2 Abdo soft, non tender, relaxed gravid uterus. Bedside sono confirmed partial footling breech with head in RUQ.  FHT 150s/ category I, no contractions noted. Cx closed at office exam 3 days back, so not repeated.   V.Praneel Haisley, MD

## 2014-07-21 ENCOUNTER — Inpatient Hospital Stay (HOSPITAL_COMMUNITY)
Admission: AD | Admit: 2014-07-21 | Discharge: 2014-07-23 | DRG: 775 | Disposition: A | Discharge status: Home / Self Care | Payer: Medicaid Other | Source: Ambulatory Visit | Attending: Obstetrics and Gynecology | Admitting: Obstetrics and Gynecology

## 2014-07-21 ENCOUNTER — Inpatient Hospital Stay (HOSPITAL_COMMUNITY): Payer: Medicaid Other | Admitting: Anesthesiology

## 2014-07-21 ENCOUNTER — Encounter (HOSPITAL_COMMUNITY): Payer: Self-pay

## 2014-07-21 DIAGNOSIS — Z3A38 38 weeks gestation of pregnancy: Secondary | ICD-10-CM | POA: Diagnosis present

## 2014-07-21 DIAGNOSIS — O24429 Gestational diabetes mellitus in childbirth, unspecified control: Secondary | ICD-10-CM | POA: Diagnosis present

## 2014-07-21 DIAGNOSIS — O24419 Gestational diabetes mellitus in pregnancy, unspecified control: Secondary | ICD-10-CM | POA: Diagnosis present

## 2014-07-21 HISTORY — DX: Gestational diabetes mellitus in pregnancy, unspecified control: O24.419

## 2014-07-21 LAB — CBC
HEMATOCRIT: 39.7 % (ref 36.0–46.0)
Hemoglobin: 13.4 g/dL (ref 12.0–15.0)
MCH: 28.1 pg (ref 26.0–34.0)
MCHC: 33.8 g/dL (ref 30.0–36.0)
MCV: 83.2 fL (ref 78.0–100.0)
PLATELETS: 142 10*3/uL — AB (ref 150–400)
RBC: 4.77 MIL/uL (ref 3.87–5.11)
RDW: 14.2 % (ref 11.5–15.5)
WBC: 11.5 10*3/uL — ABNORMAL HIGH (ref 4.0–10.5)

## 2014-07-21 LAB — TYPE AND SCREEN
ABO/RH(D): A POS
Antibody Screen: NEGATIVE

## 2014-07-21 LAB — GLUCOSE, CAPILLARY: Glucose-Capillary: 101 mg/dL — ABNORMAL HIGH (ref 70–99)

## 2014-07-21 MED ORDER — FENTANYL 2.5 MCG/ML BUPIVACAINE 1/10 % EPIDURAL INFUSION (WH - ANES)
INTRAMUSCULAR | Status: DC | PRN
  Administered 2014-07-21: 12 mL/h via EPIDURAL

## 2014-07-21 MED ORDER — OXYTOCIN BOLUS FROM INFUSION
500.0000 mL | INTRAVENOUS | Status: DC
  Administered 2014-07-22: 500 mL via INTRAVENOUS

## 2014-07-21 MED ORDER — LACTATED RINGERS IV SOLN
INTRAVENOUS | Status: DC
  Administered 2014-07-21: 23:00:00 via INTRAVENOUS

## 2014-07-21 MED ORDER — ONDANSETRON HCL 4 MG/2ML IJ SOLN: 4.0000 mg | Freq: Four times a day (QID) | INTRAMUSCULAR | Status: DC | PRN

## 2014-07-21 MED ORDER — DIPHENHYDRAMINE HCL 50 MG/ML IJ SOLN: 12.5000 mg | INTRAMUSCULAR | Status: DC | PRN

## 2014-07-21 MED ORDER — EPHEDRINE 5 MG/ML INJ
10.0000 mg | INTRAVENOUS | Status: DC | PRN
  Filled 2014-07-21: qty 2

## 2014-07-21 MED ORDER — LACTATED RINGERS IV SOLN
500.0000 mL | Freq: Once | INTRAVENOUS | Status: AC
  Administered 2014-07-21: 500 mL via INTRAVENOUS

## 2014-07-21 MED ORDER — OXYTOCIN 40 UNITS IN LACTATED RINGERS INFUSION - SIMPLE MED
62.5000 mL/h | INTRAVENOUS | Status: DC
  Administered 2014-07-22: 62.5 mL/h via INTRAVENOUS
  Filled 2014-07-21: qty 1000

## 2014-07-21 MED ORDER — PHENYLEPHRINE 40 MCG/ML (10ML) SYRINGE FOR IV PUSH (FOR BLOOD PRESSURE SUPPORT)
80.0000 ug | PREFILLED_SYRINGE | INTRAVENOUS | Status: DC | PRN
  Filled 2014-07-21: qty 20
  Filled 2014-07-21: qty 2

## 2014-07-21 MED ORDER — PHENYLEPHRINE 40 MCG/ML (10ML) SYRINGE FOR IV PUSH (FOR BLOOD PRESSURE SUPPORT)
80.0000 ug | PREFILLED_SYRINGE | INTRAVENOUS | Status: DC | PRN
  Filled 2014-07-21: qty 2

## 2014-07-21 MED ORDER — FLEET ENEMA 7-19 GM/118ML RE ENEM: 1.0000 | ENEMA | RECTAL | Status: DC | PRN

## 2014-07-21 MED ORDER — FENTANYL 2.5 MCG/ML BUPIVACAINE 1/10 % EPIDURAL INFUSION (WH - ANES)
14.0000 mL/h | INTRAMUSCULAR | Status: DC | PRN
  Administered 2014-07-21: 14 mL/h via EPIDURAL
  Filled 2014-07-21: qty 125

## 2014-07-21 MED ORDER — BUPIVACAINE HCL (PF) 0.25 % IJ SOLN
INTRAMUSCULAR | Status: DC | PRN
  Administered 2014-07-21 (×2): 4 mL via EPIDURAL

## 2014-07-21 MED ORDER — OXYCODONE-ACETAMINOPHEN 5-325 MG PO TABS: 1.0000 | ORAL_TABLET | ORAL | Status: DC | PRN

## 2014-07-21 MED ORDER — CITRIC ACID-SODIUM CITRATE 334-500 MG/5ML PO SOLN: 30.0000 mL | ORAL | Status: DC | PRN

## 2014-07-21 MED ORDER — LACTATED RINGERS IV SOLN
500.0000 mL | INTRAVENOUS | Status: DC | PRN
  Administered 2014-07-22 (×2): 500 mL via INTRAVENOUS

## 2014-07-21 MED ORDER — LIDOCAINE HCL (PF) 1 % IJ SOLN
30.0000 mL | INTRAMUSCULAR | Status: DC | PRN
  Filled 2014-07-21: qty 30

## 2014-07-21 MED ORDER — OXYCODONE-ACETAMINOPHEN 5-325 MG PO TABS: 2.0000 | ORAL_TABLET | ORAL | Status: DC | PRN

## 2014-07-21 MED ORDER — LIDOCAINE-EPINEPHRINE (PF) 1.5 %-1:200000 IJ SOLN
INTRAMUSCULAR | Status: DC | PRN
  Administered 2014-07-21: 3 mL

## 2014-07-21 MED ORDER — ACETAMINOPHEN 325 MG PO TABS: 650.0000 mg | ORAL_TABLET | ORAL | Status: DC | PRN

## 2014-07-21 NOTE — H&P (Signed)
Deanna Nguyen is a 29 y.o. female presenting @ 6638 6/7 weeks in active labor. Class A2 GDM / EFW 2/4 6lb. S/p ECV for frank breech. Intact membrane. GBS cx neg. Last metformin dose 7pm today . Maternal Medical History:  Reason for admission: Contractions.   Prenatal Complications - Diabetes: gestational. Diabetes is managed by oral agent (monotherapy).      OB History    Gravida Para Term Preterm AB TAB SAB Ectopic Multiple Living   2 1 1       1      Past Medical History  Diagnosis Date  . Gestational diabetes    Past Surgical History  Procedure Laterality Date  . No past surgeries     Family History: family history includes Hypertension in her father, maternal grandfather, maternal grandmother, mother, paternal grandfather, and paternal grandmother. Social History:  reports that she has never smoked. She does not have any smokeless tobacco history on file. She reports that she does not drink alcohol or use illicit drugs.   Prenatal Transfer Tool  Maternal Diabetes: No Genetic Screening: Declined Maternal Ultrasounds/Referrals: Normal Fetal Ultrasounds or other Referrals:  None Maternal Substance Abuse:  No Significant Maternal Medications:  Meds include: Other: metformin, glyburide Significant Maternal Lab Results:  Lab values include: Group B Strep negative Other Comments:  not consistent with diabetes  med  Review of Systems  All other systems reviewed and are negative.   Dilation: 6 Effacement (%): 80 Station: -1 Exam by:: Deanna Nguyen  Blood pressure 123/96, pulse 85, temperature 98.8 F (37.1 C), temperature source Oral, resp. rate 18, height 5\' 1"  (1.549 m), weight 70.761 kg (156 lb), unknown if currently breastfeeding. Exam Physical Exam  Constitutional: She is oriented to person, place, and time. She appears well-developed and well-nourished.  HENT:  Head: Atraumatic.  Eyes: EOM are normal.  Neck: Neck supple.  Cardiovascular: Regular rhythm.    Respiratory: Breath sounds normal.  GI: Soft.  Musculoskeletal: Normal range of motion.  Neurological: She is alert and oriented to person, place, and time.  Skin: Skin is warm and dry.  Psychiatric: She has a normal mood and affect.    Prenatal labs: ABO, Rh: A/Positive/-- (09/11 0000) Antibody: Negative (09/11 0000) Rubella: Immune (09/11 0000) RPR: Nonreactive (09/11 0000)  HBsAg: Negative (09/11 0000)  HIV: Non-reactive (09/11 0000)  GBS:   negative  Assessment/Plan: Active labor Class A2 GDM IUP@ 38 6/7 weeks  P) admit. Routine labs. BS q 2 hr. Epidural. Amniotomy. Pitocin prn. Confirm presentation Deanna Nguyen A 07/21/2014, 11:00 PM

## 2014-07-21 NOTE — Anesthesia Preprocedure Evaluation (Signed)
Anesthesia Evaluation  Patient identified by MRN, date of birth, ID band Patient awake    Reviewed: Allergy & Precautions, NPO status , Patient's Chart, lab work & pertinent test results  History of Anesthesia Complications Negative for: history of anesthetic complications  Airway Mallampati: I  TM Distance: >3 FB Neck ROM: Full    Dental  (+) Teeth Intact   Pulmonary neg pulmonary ROS,          Cardiovascular negative cardio ROS  Rhythm:Regular     Neuro/Psych negative neurological ROS  negative psych ROS   GI/Hepatic negative GI ROS, Neg liver ROS,   Endo/Other  diabetes, Gestational  Renal/GU      Musculoskeletal   Abdominal   Peds  Hematology   Anesthesia Other Findings   Reproductive/Obstetrics (+) Pregnancy                             Anesthesia Physical Anesthesia Plan  ASA: II  Anesthesia Plan: Epidural   Post-op Pain Management:    Induction:   Airway Management Planned:   Additional Equipment:   Intra-op Plan:   Post-operative Plan:   Informed Consent: I have reviewed the patients History and Physical, chart, labs and discussed the procedure including the risks, benefits and alternatives for the proposed anesthesia with the patient or authorized representative who has indicated his/her understanding and acceptance.     Plan Discussed with: Anesthesiologist  Anesthesia Plan Comments:         Anesthesia Quick Evaluation

## 2014-07-21 NOTE — Anesthesia Procedure Notes (Signed)
Epidural Patient location during procedure: OB  Staffing Anesthesiologist: Bilal Manzer, CHRIS Performed by: anesthesiologist   Preanesthetic Checklist Completed: patient identified, surgical consent, pre-op evaluation, timeout performed, IV checked, risks and benefits discussed and monitors and equipment checked  Epidural Patient position: sitting Prep: site prepped and draped and DuraPrep Patient monitoring: heart rate, cardiac monitor, continuous pulse ox and blood pressure Approach: midline Location: L3-L4 Injection technique: LOR saline  Needle:  Needle type: Tuohy  Needle gauge: 17 G Needle length: 9 cm Needle insertion depth: 4 cm Catheter type: closed end flexible Catheter size: 19 Gauge Catheter at skin depth: 12 cm Test dose: negative and 1.5% lidocaine with Epi 1:200 K  Assessment Events: blood not aspirated, injection not painful, no injection resistance, negative IV test and no paresthesia  Additional Notes H+P and labs checked, risks and benefits discussed with the patient, consent obtained, procedure tolerated well and without complications.  Reason for block:procedure for pain

## 2014-07-22 ENCOUNTER — Encounter (HOSPITAL_COMMUNITY): Payer: Self-pay

## 2014-07-22 LAB — RPR: RPR: NONREACTIVE

## 2014-07-22 LAB — GLUCOSE, CAPILLARY: GLUCOSE-CAPILLARY: 106 mg/dL — AB (ref 70–99)

## 2014-07-22 MED ORDER — ONDANSETRON HCL 4 MG PO TABS: 4.0000 mg | ORAL_TABLET | ORAL | Status: DC | PRN

## 2014-07-22 MED ORDER — IBUPROFEN 600 MG PO TABS
600.0000 mg | ORAL_TABLET | Freq: Four times a day (QID) | ORAL | Status: DC
  Administered 2014-07-22 – 2014-07-23 (×6): 600 mg via ORAL
  Filled 2014-07-22 (×6): qty 1

## 2014-07-22 MED ORDER — SIMETHICONE 80 MG PO CHEW: 80.0000 mg | CHEWABLE_TABLET | ORAL | Status: DC | PRN

## 2014-07-22 MED ORDER — WITCH HAZEL-GLYCERIN EX PADS
1.0000 "application " | MEDICATED_PAD | CUTANEOUS | Status: DC | PRN
  Administered 2014-07-22: 1 via TOPICAL

## 2014-07-22 MED ORDER — OXYCODONE-ACETAMINOPHEN 5-325 MG PO TABS
1.0000 | ORAL_TABLET | ORAL | Status: DC | PRN
  Administered 2014-07-22: 1 via ORAL
  Filled 2014-07-22: qty 1

## 2014-07-22 MED ORDER — DIPHENHYDRAMINE HCL 25 MG PO CAPS: 25.0000 mg | ORAL_CAPSULE | Freq: Four times a day (QID) | ORAL | Status: DC | PRN

## 2014-07-22 MED ORDER — ONDANSETRON HCL 4 MG/2ML IJ SOLN: 4.0000 mg | INTRAMUSCULAR | Status: DC | PRN

## 2014-07-22 MED ORDER — FERROUS SULFATE 325 (65 FE) MG PO TABS
325.0000 mg | ORAL_TABLET | Freq: Two times a day (BID) | ORAL | Status: DC
  Administered 2014-07-22 – 2014-07-23 (×3): 325 mg via ORAL
  Filled 2014-07-22 (×3): qty 1

## 2014-07-22 MED ORDER — ZOLPIDEM TARTRATE 5 MG PO TABS: 5.0000 mg | ORAL_TABLET | Freq: Every evening | ORAL | Status: DC | PRN

## 2014-07-22 MED ORDER — SENNOSIDES-DOCUSATE SODIUM 8.6-50 MG PO TABS
2.0000 | ORAL_TABLET | ORAL | Status: DC
  Administered 2014-07-23: 2 via ORAL
  Filled 2014-07-22: qty 2

## 2014-07-22 MED ORDER — LACTATED RINGERS IV SOLN
INTRAVENOUS | Status: DC
  Administered 2014-07-22: 01:00:00 via INTRAUTERINE

## 2014-07-22 MED ORDER — BENZOCAINE-MENTHOL 20-0.5 % EX AERO
1.0000 "application " | INHALATION_SPRAY | CUTANEOUS | Status: DC | PRN
  Administered 2014-07-22: 1 via TOPICAL
  Filled 2014-07-22: qty 56

## 2014-07-22 MED ORDER — OXYCODONE-ACETAMINOPHEN 5-325 MG PO TABS: 2.0000 | ORAL_TABLET | ORAL | Status: DC | PRN

## 2014-07-22 MED ORDER — DIBUCAINE 1 % RE OINT
1.0000 "application " | TOPICAL_OINTMENT | RECTAL | Status: DC | PRN
  Administered 2014-07-22: 1 via RECTAL
  Filled 2014-07-22: qty 28

## 2014-07-22 MED ORDER — LANOLIN HYDROUS EX OINT: TOPICAL_OINTMENT | CUTANEOUS | Status: DC | PRN

## 2014-07-22 MED ORDER — PRENATAL MULTIVITAMIN CH
1.0000 | ORAL_TABLET | Freq: Every day | ORAL | Status: DC
  Administered 2014-07-22 – 2014-07-23 (×2): 1 via ORAL
  Filled 2014-07-22 (×2): qty 1

## 2014-07-22 NOTE — Lactation Note (Signed)
This note was copied from the chart of Deanna Nguyen. Lactation Consultation Note Initial visit at 16 hours of age.  Mom reports offering baby breast and bottle because she doesn't have any milk.  Assisted with hand expression and observed a large drop of colostrum.  Mom smiled and was encouraged she has colostrum for her baby.  Encouraged mom to feed on demand to establish a milk supply.  Mom declines undressing baby for STS with feeding attempt, benefits discussed.  Mom is concerned she needs a larger flange for hand pump, #27 given.  Right nipple is semiflat and everts with hand pump.  Instructed mom on cross cradle hold on right breast.  Mom prefers left breast and explained she needs to alternate breasts.  Mom may need more assist with positioning.  Baby latched well with breast "sandwiching". Baby has wide open mouth with flanged lips and a few rhythmic sucks and then needed stimulation.  Good strong jaw movement noted, no audible swallows.  Baby maintained latch for 7 minutes and then layed on mom's chest asleep.  Discussed allowing nose to touch breast and to not pull breast from baby's mouth.  FOB at bedside supportive.  [facility] resources given and discussed.  Encouraged to feed with early cues on demand.  Early newborn behavior discussed.   Mom to call for assist as needed.       Patient Name: Deanna Deanna Nguyen Today's Date: 07/22/2014 Reason for consult: Initial assessment   Maternal Data Has patient been taught Hand Expression?: Yes  Feeding Feeding Type: Breast Fed Length of feed:  (7 minutes)  LATCH Score/Interventions Latch: Repeated attempts needed to sustain latch, nipple held in mouth throughout feeding, stimulation needed to elicit sucking reflex. Intervention(s): Adjust position;Assist with latch;Breast massage;Breast compression  Audible Swallowing: A few with stimulation  Type of Nipple: Flat Intervention(s): Hand pump  Comfort (Breast/Nipple): Soft / non-tender     Hold (Positioning): Assistance needed to correctly position infant at breast and maintain latch. Intervention(s): Skin to skin;Position options;Support Pillows;Breastfeeding basics reviewed  LATCH Score: 6  Lactation Tools Discussed/Used Date initiated:: 07/22/14   Consult Status Consult Status: Follow-up Date: 07/23/14 Follow-up type: In-patient    Jannifer RodneyShoptaw, Jana Lynn 07/22/2014, 8:42 PM

## 2014-07-22 NOTE — Anesthesia Postprocedure Evaluation (Signed)
3 Anesthesia Post-op Note  Patient: Deanna Nguyen  Procedure(s) Performed: * No procedures listed *  Patient Location: Mother/Baby  Anesthesia Type:Epidural  Level of Consciousness: awake, alert , oriented and patient cooperative  Airway and Oxygen Therapy: Patient Spontanous Breathing  Post-op Pain: mild  Post-op Assessment: Post-op Vital signs reviewed and Patient's Cardiovascular Status Stable  Post-op Vital Signs: Reviewed and stable  Last Vitals:  Filed Vitals:   07/22/14 0620  BP: 125/79  Pulse: 98  Temp: 37.5 C  Resp: 18    Complications: No apparent anesthesia complications

## 2014-07-22 NOTE — Progress Notes (Signed)
S: epidural Comfortable  O: bedside sono. OP vertex VE taut amniotic  Bag. arom  Clear fluid 6/90/0 asynclitic IUPC/ISE placed  Tracing: baseline 130 (+) variable decel noted when turned left Ctx q  BS 101 IMP: active phase IUP @ 39 week Class A2 GDM ]P) amnioinfusion. Monitor closely. Add pitocin prn
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
## 2014-07-22 NOTE — Progress Notes (Signed)
UR chart review completed.  

## 2014-07-22 NOTE — Consult Note (Signed)
Neonatology Note:   Attendance at Delivery:   I was asked by Dr. Cousins to attend this NSVD at term due to repetitive FHR decelerations. The mother is a G2P1 A pos, GBS neg with GDM, on Metformin and Glyburide. ROM 3.5 hours prior to delivery, fluid clear. Infant was a little floppy and pale at birth. We did bulb suctioning for some thick mucous, after which she cried well and pinked up quickly. She was moving all extremities by 1 minute of life and tone improved gradually, normal by 5 minutes. She spat up more thick, clear mucous, so we bulb suctioned again, then DeLee suctioned to the stomach for removal of 4-5 ml thick, clear mucous. Ap 8/9. Lungs clear to ausc in DR. To CN to care of Pediatrician.  Deanna Alvi C. [name], MD 

## 2014-07-22 NOTE — Progress Notes (Signed)
Called for severe variable deceleration. Pt is fully (+) 2 station On hand and knees Mat O2 Pt placed on back. Pedi called . Pt and husband advised of need for vacuum assistance.

## 2014-07-23 ENCOUNTER — Telehealth (HOSPITAL_COMMUNITY): Payer: Self-pay | Admitting: *Deleted

## 2014-07-23 LAB — CBC
HCT: 32.2 % — ABNORMAL LOW (ref 36.0–46.0)
Hemoglobin: 10.6 g/dL — ABNORMAL LOW (ref 12.0–15.0)
MCH: 27.7 pg (ref 26.0–34.0)
MCHC: 32.9 g/dL (ref 30.0–36.0)
MCV: 84.3 fL (ref 78.0–100.0)
PLATELETS: 121 10*3/uL — AB (ref 150–400)
RBC: 3.82 MIL/uL — ABNORMAL LOW (ref 3.87–5.11)
RDW: 14.4 % (ref 11.5–15.5)
WBC: 12.8 10*3/uL — AB (ref 4.0–10.5)

## 2014-07-23 MED ORDER — FERROUS SULFATE 325 (65 FE) MG PO TABS: 325.0000 mg | ORAL_TABLET | Freq: Two times a day (BID) | ORAL | Status: DC

## 2014-07-23 MED ORDER — IBUPROFEN 600 MG PO TABS: 600.0000 mg | ORAL_TABLET | Freq: Four times a day (QID) | ORAL | Status: AC

## 2014-07-23 NOTE — Progress Notes (Signed)
Patient ID: Deanna Nguyen, female   DOB: 1986/04/09, 29 y.o.   MRN: [id number] PPD # 1 SVD  S:  Reports feeling well and desires to go home today             Tolerating po/ No nausea or vomiting             Bleeding is light             Pain controlled with ibuprofen (OTC)             Up ad lib / ambulatory / voiding without difficulties    Newborn  Information for the patient's newborn:  Deanna Nguyen, Deanna Nguyen [[id number]][[id number]]  female  breast feeding    O:  A & O x 3, in no apparent distress              VS:  Filed Vitals:   07/22/14 0620 07/22/14 1045 07/22/14 1836 07/23/14 0546  BP: 125/79 111/70 116/68 115/83  Pulse: 98 90 85 103  Temp: 99.5 F (37.5 C) 98.3 F (36.8 C) 98.4 F (36.9 C) 98.3 F (36.8 C)  TempSrc: Oral Oral Oral Oral  Resp: 18 18 18 18   Height:      Weight:      SpO2:    99%    LABS:  Recent Labs  07/21/14 2234 07/23/14 0556  WBC 11.5* 12.8*  HGB 13.4 10.6*  HCT 39.7 32.2*  PLT 142* 121*    Blood type: A POS (02/29 2234)  Rubella: Immune (09/11 0000)   I&O: I/O last 3 completed shifts: In: -  Out: 1650 [Urine:1400; Blood:250]             Lungs: Clear and unlabored  Heart: regular rate and rhythm / no murmurs  Abdomen: soft, non-tender, non-distended              Fundus: firm, non-tender, U-1  Perineum: 1st degree perineal repair healing well, no edema, ecchymosis, or erythema  Lochia: minimal  Extremities: No edema, no calf pain or tenderness, No Homans    A/P: PPD # 1  28 y.o., [id number]   Principal Problem:    Postpartum care following vacuum-assisted vaginal delivery (3/1)   Doing well - stable status  Routine post partum orders  Discharge home today  Arita MissAWSON, Seleen Walter, M, MSN, CNM 07/23/2014, 11:39 AM

## 2014-07-23 NOTE — Progress Notes (Signed)
Patient ID: Deanna Nguyen, female   DOB: [birth year], 29 y.o.   MRN: [id number] PPD # 1 SVD  S:  Reports feeling well and ready for early discharge             Tolerating po/ No nausea or vomiting             Bleeding is light             Pain controlled with ibuprofen (OTC)             Up ad lib / ambulatory / voiding without difficulties    Newborn  Information for the patient's newborn:  [name], Girl Jenny [[id number]][[id number]]  female  breast feeding    O:  A & O x 3, in no apparent distress              VS:  Filed Vitals:   07/22/14 0620 07/22/14 1045 07/22/14 1836 07/23/14 0546  BP: 125/79 111/70 116/68 115/83  Pulse: 98 90 85 103  Temp: 99.5 F (37.5 C) 98.3 F (36.8 C) 98.4 F (36.9 C) 98.3 F (36.8 C)  TempSrc: Oral Oral Oral Oral  Resp: 18 18 18 18   Height:      Weight:      SpO2:    99%    LABS:  Recent Labs  07/21/14 2234 07/23/14 0556  WBC 11.5* 12.8*  HGB 13.4 10.6*  HCT 39.7 32.2*  PLT 142* 121*    Blood type: A POS (02/29 2234)  Rubella: Immune (09/11 0000)   I&O: I/O last 3 completed shifts: In: -  Out: 1650 [Urine:1400; Blood:250]             Lungs: Clear and unlabored  Heart: regular rate and rhythm / no murmurs  Abdomen: soft, non-tender, non-distended              Fundus: firm, non-tender, U-1  Perineum: 1st degree perineal repair healing well, no edema, ecchymosis, or erythema  Lochia: minimal  Extremities: No edema, no calf pain or tenderness, No Homans    A/P: PPD # 1  28 y.o., [id number] S/P VAVD with 1st degree laceration  Principal Problem:   Postpartum care following vacuum-assisted vaginal delivery (3/1)   Doing well - stable status  Routine post partum orders  Early discharge home today  Arita MissAWSON, Jaimey Franchini, M, MSN, CNM 07/23/2014, 11:50 AM

## 2014-07-23 NOTE — Discharge Summary (Signed)
Obstetric Discharge Summary  Reason for Admission: Pt is a G2P2002 at 7075w0d active labor. Class A2 GDM.  [facility] 13.6 wks, with Dr. Juliene PinaMody as primary provider.  Medications on Admission: Prescriptions prior to admission  Medication Sig Dispense Refill Last Dose  . metFORMIN (GLUCOPHAGE) 500 MG tablet Take by mouth 2 (two) times daily with a meal.   07/21/2014 at Unknown time  . Prenatal Vit-Fe Fumarate-FA (PRENATAL MULTIVITAMIN) TABS tablet Take 1 tablet by mouth daily at 12 noon.   Past Week at Unknown time    Prenatal Labs: ABO, Rh: A POS (02/29 2234)  Antibody: NEG (02/29 2234) Rubella: Immune (09/11 0000)  RPR: Non Reactive (02/29 2234)  HBsAg: Negative (09/11 0000)  HIV: Non-reactive (09/11 0000)  GTT : Abnormal - 229 / GDM GBS:  Negative   Prenatal Procedures: ultrasound Intrapartum Course: Admitted for active labor / AROM - clear / epidural for pain management /  normal progression to complete dilation / positioned to hands and knees for severe variable deceleration in FHR / VAVD of viable female with 1st degree repair by Dr. Cherly Hensenousins / no immediate postpartum complications Intrapartum Procedures: vacuum Postpartum Procedures: none Complications-Operative and Postpartum: 1st degree perineal laceration  Labs: HEMOGLOBIN  Date Value Ref Range Status  07/23/2014 10.6* 12.0 - 15.0 g/dL Final    Comment:    DELTA CHECK NOTED REPEATED TO VERIFY    HCT  Date Value Ref Range Status  07/23/2014 32.2* 36.0 - 46.0 % Final   Lab Results  Component Value Date   PLT 121* 07/23/2014    Newborn Data: Live born female  Birth Weight: 7 lb 8.6 oz (3420 g) APGAR: 8, 9  Home with mother.   Discharge Information: Date: 07/23/2014 Discharge Diagnoses:  Pt is a G2P2002 at 4675w0d S/P Term Pregnancy-delivered on 07/22/2014  Condition: stable Activity: pelvic rest Diet: routine Medications:    Medication List    STOP taking these  medications        metFORMIN 500 MG tablet  Commonly known as:  GLUCOPHAGE      TAKE these medications        ferrous sulfate 325 (65 FE) MG tablet  Take 1 tablet (325 mg total) by mouth 2 (two) times daily with a meal.     ibuprofen 600 MG tablet  Commonly known as:  ADVIL,MOTRIN  Take 1 tablet (600 mg total) by mouth every 6 (six) hours.     prenatal multivitamin Tabs tablet  Take 1 tablet by mouth daily at 12 noon.       Instructions: [facility] and reviewed Discharge to: home     Follow-up Information    Follow up with MODY,VAISHALI R, MD. Schedule an appointment as soon as possible for a visit in 6 weeks.   Specialty:  Obstetrics and Gynecology   Why:  postpartum visit   Contact information:   [address] [place] [address] [phone]       Raelyn MoraAWSON, Mamoru Takeshita, Judie PetitM, MSN, CNM 07/23/2014, 11:57 AM

## 2014-07-23 NOTE — Discharge Instructions (Signed)
Breast Pumping Tips °If you are breastfeeding, there may be times when you cannot feed your baby directly. Returning to work or going on a trip are common examples. Pumping allows you to store breast milk and feed it to your baby later.  °You may not get much milk when you first start to pump. Your breasts should start to make more after a few days. If you pump at the times you usually feed your baby, you may be able to keep making enough milk to feed your baby without also using formula. The more often you pump, the more milk you will produce.  °WHEN SHOULD I PUMP?  °· You can begin to pump soon after delivery. However, some experts recommend waiting about 4 weeks before giving your infant a bottle to make sure breastfeeding is going well.  °· If you plan to return to work, begin pumping a few weeks before. This will help you develop techniques that work best for you. It also lets you build up a supply of breast milk.   °· When you are with your infant, feed on demand and pump after each feeding.   °· When you are away from your infant for several hours, pump for about 15 minutes every 2-3 hours. Pump both breasts at the same time if you can.   °· If your infant has a formula feeding, make sure to pump around the same time.     °· If you drink any alcohol, wait 2 hours before pumping.   °HOW DO I PREPARE TO PUMP? °Your let-down reflex is the natural reaction to stimulation that makes your breast milk flow. It is easier to stimulate this reflex when you are relaxed. Find relaxation techniques that work for you. If you have difficulty with your let-down reflex, try these methods:  °· Smell one of your infant's blankets or an item of clothing.   °· Look at a picture or video of your infant.   °· Sit in a quiet, private space.   °· Massage the breast you plan to pump.   °· Place soothing warmth on the breast.   °· Play relaxing music.   °WHAT ARE SOME GENERAL BREAST PUMPING TIPS? °· Wash your hands before you pump. You  do not need to wash your nipples or breasts. °· There are three ways to pump. °¨ You can use your hand to massage and compress your breast. °¨ You can use a handheld manual pump. °¨ You can use an electric pump.   °· Make sure the suction cup (flange) on the breast pump is the right size. Place the flange directly over the nipple. If it is the wrong size or placed the wrong way, it may be painful and cause nipple damage.   °· If pumping is uncomfortable, apply a small amount of purified or modified lanolin to your nipple and areola. °· If you are using an electric pump, adjust the speed and suction power to be more comfortable. °· If pumping is painful or if you are not getting very much milk, you may need a different type of pump. A lactation consultant can help you determine what type of pump to use.   °· Keep a full water bottle near you at all times. Drinking lots of fluid helps you make more milk.  °· You can store your milk to use later. Pumped breast milk can be stored in a sealable, sterile container or plastic bag. Label all stored breast milk with the date you pumped it. °¨ Milk can stay out at room temperature for up to 8 hours. °¨   You can store your milk in the refrigerator for up to 8 days. °¨ You can store your milk in the freezer for 3 months. Thaw frozen milk using warm water. Do not put it in the microwave. °· Do not smoke. Smoking can lower your milk supply and harm your infant. If you need help quitting, ask your health care provider to recommend a program.   °WHEN SHOULD I CALL MY HEALTH CARE PROVIDER OR A LACTATION CONSULTANT? °· You are having trouble pumping. °· You are concerned that you are not making enough milk. °· You have nipple pain, soreness, or redness. °· You want to use birth control. Birth control pills may lower your milk supply. Talk to your health care provider about your options. °Document Released: 10/27/2009 Document Revised: 05/14/2013 Document Reviewed:  03/01/2013 °Exit[employer]® Patient Information ©2015 [employer]. This information is not intended to replace advice given to you by your health care provider. Make sure you discuss any questions you have with your health care provider. ° °Nutrition for the New Mother  °A new mother needs good health and nutrition so she can have energy to take care of a new baby. Whether a mother breastfeeds or formula feeds the baby, it is important to have a well-balanced diet. Foods from all the food groups should be chosen to meet the new mother's energy needs and to give her the nutrients needed for repair and healing.  °A HEALTHY EATING PLAN °The My [employer] plan for Moms outlines what you should eat to help you and your baby stay healthy. The energy and amount of food you need depends on whether or not you are breastfeeding. If you are breastfeeding you will need more nutrients. If you choose not to breastfeed, your nutrition goal should be to return to a healthy weight. Limiting calories may be needed if you are not breastfeeding.  °HOME CARE INSTRUCTIONS  °· For a personal plan based on your unique needs, see your Registered Dietitian or visit www.mypyramid.gov. °· Eat a variety of foods. The plan below will help guide you. The following chart has a suggested daily meal plan from the [employer] for Moms. °· Eat a variety of fruits and vegetables. °· Eat more dark green and orange vegetables and cooked dried beans. °· Make half your grains whole grains. Choose whole instead of refined grains. °· Choose low-fat or lean meats and poultry. °· Choose low-fat or fat-free dairy products like milk, cheese, or yogurt. °Fruits °· Breastfeeding: 2 cups °· Non-Breastfeeding: 2 cups °· What Counts as a serving? °¨ 1 cup of fruit or juice. °¨ ½ cup dried fruit. °Vegetables °· Breastfeeding: 3 cups °· Non-Breastfeeding: 2 ½ cups °· What Counts as a serving? °¨ 1 cup raw or cooked vegetables. °¨ Juice or 2 cups raw leafy  vegetables. °Grains °· Breastfeeding: 8 oz °· Non-Breastfeeding: 6 oz °· What Counts as a serving? °¨ 1 slice bread. °¨ 1 oz ready-to-eat cereal. °¨ ½ cup cooked pasta, rice, or cereal. °Meat and Beans °· Breastfeeding: 6 ½ oz °· Non-Breastfeeding: 5 ½ oz °· What Counts as a serving? °¨ 1 oz lean meat, poultry, or fish °¨ ¼ cup cooked dry beans °¨ ½ oz nuts or 1 egg °¨ 1 tbs peanut butter °Milk °· Breastfeeding: 3 cups °· Non-Breastfeeding: 3 cups °· What Counts as a serving? °¨ 1 cup milk. °¨ 8 oz yogurt. °¨ 1 ½ oz cheese. °¨ 2 oz processed cheese. °TIPS FOR THE BREASTFEEDING MOM °· Rapid weight   loss is not suggested when you are breastfeeding. By simply breastfeeding, you will be able to lose the weight gained during your pregnancy. Your caregiver can keep track of your weight and tell you if your weight loss is appropriate. °· Be sure to drink fluids. You may notice that you are thirstier than usual. A suggestion is to drink a glass of water or other beverage whenever you breastfeed. °· Avoid alcohol as it can be passed into your breast milk. °· Limit caffeine drinks to no more than 2 to 3 cups per day. °· You may need to keep taking your prenatal vitamin while you are breastfeeding. Talk with your caregiver about taking a vitamin or supplement. °RETURING TO A HEALTHY WEIGHT °· The My [employer] Plan for Moms will help you return to a healthy weight. It will also provide the nutrients you need. °· You may need to limit "empty" calories. These include: °¨ High fat foods like fried foods, fatty meats, fast food, butter, and mayonnaise. °¨ High sugar foods like sodas, jelly, candy, and sweets. °· Be physically active. Include 30 minutes of exercise or more each day. Choose an activity you like such as walking, swimming, biking, or aerobics. Check with your caregiver before you start to exercise. °Document Released: 08/16/2007 Document Revised: 08/01/2011 Document Reviewed: 08/16/2007 °ExitCare® Patient Information  ©2015 [employer]. This information is not intended to replace advice given to you by your health care provider. Make sure you discuss any questions you have with your health care provider. °Postpartum Depression and Baby Blues °The postpartum period begins right after the birth of a baby. During this time, there is often a great amount of joy and excitement. It is also a time of many changes in the life of the parents. Regardless of how many times a mother gives birth, each child brings new challenges and dynamics to the family. It is not unusual to have feelings of excitement along with confusing shifts in moods, emotions, and thoughts. All mothers are at risk of developing postpartum depression or the "baby blues." These mood changes can occur right after giving birth, or they may occur many months after giving birth. The baby blues or postpartum depression can be mild or severe. Additionally, postpartum depression can go away rather quickly, or it can be a long-term condition.  °CAUSES °Raised hormone levels and the rapid drop in those levels are thought to be a main cause of postpartum depression and the baby blues. A number of hormones change during and after pregnancy. Estrogen and progesterone usually decrease right after the delivery of your baby. The levels of thyroid hormone and various cortisol steroids also rapidly drop. Other factors that play a role in these mood changes include major life events and genetics.  °RISK FACTORS °If you have any of the following risks for the baby blues or postpartum depression, know what symptoms to watch out for during the postpartum period. Risk factors that may increase the likelihood of getting the baby blues or postpartum depression include: °· Having a personal or family history of depression.   °· Having depression while being pregnant.   °· Having premenstrual mood issues or mood issues related to oral contraceptives. °· Having a lot of life stress.   °· Having  marital conflict.   °· Lacking a social support network.   °· Having a baby with special needs.   °· Having health problems, such as diabetes.   °SIGNS AND SYMPTOMS °Symptoms of baby blues include: °· Brief changes in mood, such as going   from extreme happiness to sadness. °· Decreased concentration.   °· Difficulty sleeping.   °· Crying spells, tearfulness.   °· Irritability.   °· Anxiety.   °Symptoms of postpartum depression typically begin within the first month after giving birth. These symptoms include: °· Difficulty sleeping or excessive sleepiness.   °· Marked weight loss.   °· Agitation.   °· Feelings of worthlessness.   °· Lack of interest in activity or food.   °Postpartum psychosis is a very serious condition and can be dangerous. Fortunately, it is rare. Displaying any of the following symptoms is cause for immediate medical attention. Symptoms of postpartum psychosis include:  °· Hallucinations and delusions.   °· Bizarre or disorganized behavior.   °· Confusion or disorientation.   °DIAGNOSIS  °A diagnosis is made by an evaluation of your symptoms. There are no medical or lab tests that lead to a diagnosis, but there are various questionnaires that a health care provider may use to identify those with the baby blues, postpartum depression, or psychosis. Often, a screening tool called the Edinburgh Postnatal Depression Scale is used to diagnose depression in the postpartum period.  °TREATMENT °The baby blues usually goes away on its own in 1-2 weeks. Social support is often all that is needed. You will be encouraged to get adequate sleep and rest. Occasionally, you may be given medicines to help you sleep.  °Postpartum depression requires treatment because it can last several months or longer if it is not treated. Treatment may include individual or group therapy, medicine, or both to address any social, physiological, and psychological factors that may play a role in the depression. Regular exercise, a  healthy diet, rest, and social support may also be strongly recommended.  °Postpartum psychosis is more serious and needs treatment right away. Hospitalization is often needed. °HOME CARE INSTRUCTIONS °· Get as much rest as you can. Nap when the baby sleeps.   °· Exercise regularly. Some women find yoga and walking to be beneficial.   °· Eat a balanced and nourishing diet.   °· Do little things that you enjoy. Have a cup of tea, take a bubble bath, read your favorite magazine, or listen to your favorite music. °· Avoid alcohol.   °· Ask for help with household chores, cooking, grocery shopping, or running errands as needed. Do not try to do everything.   °· Talk to people close to you about how you are feeling. Get support from your partner, family members, friends, or other new moms. °· Try to stay positive in how you think. Think about the things you are grateful for.   °· Do not spend a lot of time alone.   °· Only take over-the-counter or prescription medicine as directed by your health care provider. °· Keep all your postpartum appointments.   °· Let your health care provider know if you have any concerns.   °SEEK MEDICAL CARE IF: °You are having a reaction to or problems with your medicine. °SEEK IMMEDIATE MEDICAL CARE IF: °· You have suicidal feelings.   °· You think you may harm the baby or someone else. °MAKE SURE YOU: °· Understand these instructions. °· Will watch your condition. °· Will get help right away if you are not doing well or get worse. °Document Released: 02/11/2004 Document Revised: 05/14/2013 Document Reviewed: 02/18/2013 °Exit[employer]® Patient Information ©2015 [employer]. This information is not intended to replace advice given to you by your health care provider. Make sure you discuss any questions you have with your health care provider. °Breastfeeding and Mastitis °Mastitis is inflammation of the breast tissue. It can occur in women who   are breastfeeding. This can make breastfeeding  painful. Mastitis will sometimes go away on its own. Your health care provider will help determine if treatment is needed. °CAUSES °Mastitis is often associated with a blocked milk (lactiferous) duct. This can happen when too much milk builds up in the breast. Causes of excess milk in the breast can include: °· Poor latch-on. If your baby is not latched onto the breast properly, she or he may not empty your breast completely while breastfeeding. °· Allowing too much time to pass between feedings. °· Wearing a bra or other clothing that is too tight. This puts extra pressure on the lactiferous ducts so milk does not flow through them as it should. °Mastitis can also be caused by a bacterial infection. Bacteria may enter the breast tissue through cuts or openings in the skin. In women who are breastfeeding, this may occur because of cracked or irritated skin. Cracks in the skin are often caused when your baby does not latch on properly to the breast. °SIGNS AND SYMPTOMS °· Swelling, redness, tenderness, and pain in an area of the breast. °· Swelling of the glands under the arm on the same side. °· Fever may or may not accompany mastitis. °If an infection is allowed to progress, a collection of pus (abscess) may develop. °DIAGNOSIS  °Your health care provider can usually diagnose mastitis based on your symptoms and a physical exam. Tests may be done to help confirm the diagnosis. These may include: °· Removal of pus from the breast by applying pressure to the area. This pus can be examined in the lab to determine which bacteria are present. If an abscess has developed, the fluid in the abscess can be removed with a needle. This can also be used to confirm the diagnosis and determine the bacteria present. In most cases, pus will not be present. °· Blood tests to determine if your body is fighting a bacterial infection. °· Mammogram or ultrasound tests to rule out other problems or diseases. °TREATMENT  °Mastitis that  occurs with breastfeeding will sometimes go away on its own. Your health care provider may choose to wait 24 hours after first seeing you to decide whether a prescription medicine is needed. If your symptoms are worse after 24 hours, your health care provider will likely prescribe an antibiotic medicine to treat the mastitis. He or she will determine which bacteria are most likely causing the infection and will then select an appropriate antibiotic medicine. This is sometimes changed based on the results of tests performed to identify the bacteria, or if there is no response to the antibiotic medicine selected. Antibiotic medicines are usually given by mouth. You may also be given medicine for pain. °HOME CARE INSTRUCTIONS °· Only take over-the-counter or prescription medicines for pain, fever, or discomfort as directed by your health care provider. °· If your health care provider prescribed an antibiotic medicine, take the medicine as directed. Make sure you finish it even if you start to feel better. °· Do not wear a tight or underwire bra. Wear a soft, supportive bra. °· Increase your fluid intake, especially if you have a fever. °· Continue to empty the breast. Your health care provider can tell you whether this milk is safe for your infant or needs to be thrown out. You may be told to stop nursing until your health care provider thinks it is safe for your baby. Use a breast pump if you are advised to stop nursing. °· Keep your nipples   clean and dry. °· Empty the first breast completely before going to the other breast. If your baby is not emptying your breasts completely for some reason, use a breast pump to empty your breasts. °· If you go back to work, pump your breasts while at work to stay in time with your nursing schedule. °· Avoid allowing your breasts to become overly filled with milk (engorged). °SEEK MEDICAL CARE IF: °· You have pus-like discharge from the breast. °· Your symptoms do not improve with  the treatment prescribed by your health care provider within 2 days. °SEEK IMMEDIATE MEDICAL CARE IF: °· Your pain and swelling are getting worse. °· You have pain that is not controlled with medicine. °· You have a red line extending from the breast toward your armpit. °· You have a fever or persistent symptoms for more than 2-3 days. °· You have a fever and your symptoms suddenly get worse. °MAKE SURE YOU:  °· Understand these instructions. °· Will watch your condition. °· Will get help right away if you are not doing well or get worse. °Document Released: 09/03/2004 Document Revised: 05/14/2013 Document Reviewed: 12/13/2012 °[employer]® Patient Information ©2015 [employer]. This information is not intended to replace advice given to you by your health care provider. Make sure you discuss any questions you have with your health care provider. °Breastfeeding °Deciding to breastfeed is one of the best choices you can make for you and your baby. A change in hormones during pregnancy causes your breast tissue to grow and increases the number and size of your milk ducts. These hormones also allow proteins, sugars, and fats from your blood supply to make breast milk in your milk-producing glands. Hormones prevent breast milk from being released before your baby is born as well as prompt milk flow after birth. Once breastfeeding has begun, thoughts of your baby, as well as his or her sucking or crying, can stimulate the release of milk from your milk-producing glands.  °BENEFITS OF BREASTFEEDING °For Your Baby °· Your first milk (colostrum) helps your baby's digestive system function better.   °· There are antibodies in your milk that help your baby fight off infections.   °· Your baby has a lower incidence of asthma, allergies, and sudden infant death syndrome.   °· The nutrients in breast milk are better for your baby than infant formulas and are designed uniquely for your baby's needs.   °· Breast milk improves your  baby's brain development.   °· Your baby is less likely to develop other conditions, such as childhood obesity, asthma, or type 2 diabetes mellitus.   °For You  °· Breastfeeding helps to create a very special bond between you and your baby.   °· Breastfeeding is convenient. Breast milk is always available at the correct temperature and costs nothing.   °· Breastfeeding helps to burn calories and helps you lose the weight gained during pregnancy.   °· Breastfeeding makes your uterus contract to its prepregnancy size faster and slows bleeding (lochia) after you give birth.   °· Breastfeeding helps to lower your risk of developing type 2 diabetes mellitus, osteoporosis, and breast or ovarian cancer later in life. °SIGNS THAT YOUR BABY IS HUNGRY °Early Signs of Hunger  °· Increased alertness or activity. °· Stretching. °· Movement of the head from side to side. °· Movement of the head and opening of the mouth when the corner of the mouth or cheek is stroked (rooting). °· Increased sucking sounds, smacking lips, cooing, sighing, or squeaking. °· Hand-to-mouth movements. °· Increased sucking of   fingers or hands. °Late Signs of Hunger °· Fussing. °· Intermittent crying. °Extreme Signs of Hunger °Signs of extreme hunger will require calming and consoling before your baby will be able to breastfeed successfully. Do not wait for the following signs of extreme hunger to occur before you initiate breastfeeding:   °· Restlessness. °· A loud, strong cry. °·  Screaming. °BREASTFEEDING BASICS °Breastfeeding Initiation °· Find a comfortable place to sit or lie down, with your neck and back well supported. °· Place a pillow or rolled up blanket under your baby to bring him or her to the level of your breast (if you are seated). Nursing pillows are specially designed to help support your arms and your baby while you breastfeed. °· Make sure that your baby's abdomen is facing your abdomen.   °· Gently massage your breast. With your  fingertips, massage from your chest wall toward your nipple in a circular motion. This encourages milk flow. You may need to continue this action during the feeding if your milk flows slowly. °· Support your breast with 4 fingers underneath and your thumb above your nipple. Make sure your fingers are well away from your nipple and your baby's mouth.   °· Stroke your baby's lips gently with your finger or nipple.   °· When your baby's mouth is open wide enough, quickly bring your baby to your breast, placing your entire nipple and as much of the colored area around your nipple (areola) as possible into your baby's mouth.   °¨ More areola should be visible above your baby's upper lip than below the lower lip.   °¨ Your baby's tongue should be between his or her lower gum and your breast.   °· Ensure that your baby's mouth is correctly positioned around your nipple (latched). Your baby's lips should create a seal on your breast and be turned out (everted). °· It is common for your baby to suck about 2-3 minutes in order to start the flow of breast milk. °Latching °Teaching your baby how to latch on to your breast properly is very important. An improper latch can cause nipple pain and decreased milk supply for you and poor weight gain in your baby. Also, if your baby is not latched onto your nipple properly, he or she may swallow some air during feeding. This can make your baby fussy. Burping your baby when you switch breasts during the feeding can help to get rid of the air. However, teaching your baby to latch on properly is still the best way to prevent fussiness from swallowing air while breastfeeding. °Signs that your baby has successfully latched on to your nipple:    °· Silent tugging or silent sucking, without causing you pain.   °· Swallowing heard between every 3-4 sucks.   °·  Muscle movement above and in front of his or her ears while sucking.   °Signs that your baby has not successfully latched on to  nipple:  °· Sucking sounds or smacking sounds from your baby while breastfeeding. °· Nipple pain. °If you think your baby has not latched on correctly, slip your finger into the corner of your baby's mouth to break the suction and place it between your baby's gums. Attempt breastfeeding initiation again. °Signs of Successful Breastfeeding °Signs from your baby:   °· A gradual decrease in the number of sucks or complete cessation of sucking.   °· Falling asleep.   °· Relaxation of his or her body.   °· Retention of a small amount of milk in his or her mouth.   °· Letting go   of your breast by himself or herself. °Signs from you: °· Breasts that have increased in firmness, weight, and size 1-3 hours after feeding.   °· Breasts that are softer immediately after breastfeeding. °· Increased milk volume, as well as a change in milk consistency and color by the fifth day of breastfeeding.   °· Nipples that are not sore, cracked, or bleeding. °Signs That Your Baby is Getting Enough Milk °· Wetting at least 3 diapers in a 24-hour period. The urine should be clear and pale yellow by age 5 days. °· At least 3 stools in a 24-hour period by age 5 days. The stool should be soft and yellow. °· At least 3 stools in a 24-hour period by age 7 days. The stool should be seedy and yellow. °· No loss of weight greater than 10% of birth weight during the first 3 days of age. °· Average weight gain of 4-7 ounces (113-198 g) per week after age 4 days. °· Consistent daily weight gain by age 5 days, without weight loss after the age of 2 weeks. °After a feeding, your baby may spit up a small amount. This is common. °BREASTFEEDING FREQUENCY AND DURATION °Frequent feeding will help you make more milk and can prevent sore nipples and breast engorgement. Breastfeed when you feel the need to reduce the fullness of your breasts or when your baby shows signs of hunger. This is called "breastfeeding on demand." Avoid introducing a pacifier to your  baby while you are working to establish breastfeeding (the first 4-6 weeks after your baby is born). After this time you may choose to use a pacifier. Research has shown that pacifier use during the first year of a baby's life decreases the risk of sudden infant death syndrome (SIDS). °Allow your baby to feed on each breast as long as he or she wants. Breastfeed until your baby is finished feeding. When your baby unlatches or falls asleep while feeding from the first breast, offer the second breast. Because newborns are often sleepy in the first few weeks of life, you may need to awaken your baby to get him or her to feed. °Breastfeeding times will vary from baby to baby. However, the following rules can serve as a guide to help you ensure that your baby is properly fed: °· Newborns (babies 4 weeks of age or younger) may breastfeed every 1-3 hours. °· Newborns should not go longer than 3 hours during the day or 5 hours during the night without breastfeeding. °· You should breastfeed your baby a minimum of 8 times in a 24-hour period until you begin to introduce solid foods to your baby at around 6 months of age. °BREAST MILK PUMPING °Pumping and storing breast milk allows you to ensure that your baby is exclusively fed your breast milk, even at times when you are unable to breastfeed. This is especially important if you are going back to work while you are still breastfeeding or when you are not able to be present during feedings. Your lactation consultant can give you guidelines on how long it is safe to store breast milk.  °A breast pump is a machine that allows you to pump milk from your breast into a sterile bottle. The pumped breast milk can then be stored in a refrigerator or freezer. Some breast pumps are operated by hand, while others use electricity. Ask your lactation consultant which type will work best for you. Breast pumps can be purchased, but some hospitals and breastfeeding support groups   lease  breast pumps on a monthly basis. A lactation consultant can teach you how to hand express breast milk, if you prefer not to use a pump.  °CARING FOR YOUR BREASTS WHILE YOU BREASTFEED °Nipples can become dry, cracked, and sore while breastfeeding. The following recommendations can help keep your breasts moisturized and healthy: °· Avoid using soap on your nipples.   °· Wear a supportive bra. Although not required, special nursing bras and tank tops are designed to allow access to your breasts for breastfeeding without taking off your entire bra or top. Avoid wearing underwire-style bras or extremely tight bras. °· Air dry your nipples for 3-4 minutes after each feeding.   °· Use only cotton bra pads to absorb leaked breast milk. Leaking of breast milk between feedings is normal.   °· Use lanolin on your nipples after breastfeeding. Lanolin helps to maintain your skin's normal moisture barrier. If you use pure lanolin, you do not need to wash it off before feeding your baby again. Pure lanolin is not toxic to your baby. You may also hand express a few drops of breast milk and gently massage that milk into your nipples and allow the milk to air dry. °In the first few weeks after giving birth, some women experience extremely full breasts (engorgement). Engorgement can make your breasts feel heavy, warm, and tender to the touch. Engorgement peaks within 3-5 days after you give birth. The following recommendations can help ease engorgement: °· Completely empty your breasts while breastfeeding or pumping. You may want to start by applying warm, moist heat (in the shower or with warm water-soaked hand towels) just before feeding or pumping. This increases circulation and helps the milk flow. If your baby does not completely empty your breasts while breastfeeding, pump any extra milk after he or she is finished. °· Wear a snug bra (nursing or regular) or tank top for 1-2 days to signal your body to slightly decrease milk  production. °· Apply ice packs to your breasts, unless this is too uncomfortable for you. °· Make sure that your baby is latched on and positioned properly while breastfeeding. °If engorgement persists after 48 hours of following these recommendations, contact your health care provider or a lactation consultant. °OVERALL HEALTH CARE RECOMMENDATIONS WHILE BREASTFEEDING °· Eat healthy foods. Alternate between meals and snacks, eating 3 of each per day. Because what you eat affects your breast milk, some of the foods may make your baby more irritable than usual. Avoid eating these foods if you are sure that they are negatively affecting your baby. °· Drink milk, fruit juice, and water to satisfy your thirst (about 10 glasses a day).   °· Rest often, relax, and continue to take your prenatal vitamins to prevent fatigue, stress, and anemia. °· Continue breast self-awareness checks. °· Avoid chewing and smoking tobacco. °· Avoid alcohol and drug use. °Some medicines that may be harmful to your baby can pass through breast milk. It is important to ask your health care provider before taking any medicine, including all over-the-counter and prescription medicine as well as vitamin and herbal supplements. °It is possible to become pregnant while breastfeeding. If birth control is desired, ask your health care provider about options that will be safe for your baby. °SEEK MEDICAL CARE IF:  °· You feel like you want to stop breastfeeding or have become frustrated with breastfeeding. °· You have painful breasts or nipples. °· Your nipples are cracked or bleeding. °· Your breasts are red, tender, or warm. °· You have   a swollen area on either breast. °· You have a fever or chills. °· You have nausea or vomiting. °· You have drainage other than breast milk from your nipples. °· Your breasts do not become full before feedings by the fifth day after you give birth. °· You feel sad and depressed. °· Your baby is too sleepy to eat  well. °· Your baby is having trouble sleeping.   °· Your baby is wetting less than 3 diapers in a 24-hour period. °· Your baby has less than 3 stools in a 24-hour period. °· Your baby's skin or the white part of his or her eyes becomes yellow.   °· Your baby is not gaining weight by 5 days of age. °SEEK IMMEDIATE MEDICAL CARE IF:  °· Your baby is overly tired (lethargic) and does not want to wake up and feed. °· Your baby develops an unexplained fever. °Document Released: 05/09/2005 Document Revised: 05/14/2013 Document Reviewed: 10/31/2012 °[employer]® Patient Information ©2015 [employer]. This information is not intended to replace advice given to you by your health care provider. Make sure you discuss any questions you have with your health care provider. ° °

## 2014-07-23 NOTE — Telephone Encounter (Signed)
Preadmission screen  

## 2014-07-24 ENCOUNTER — Inpatient Hospital Stay (HOSPITAL_COMMUNITY): Admission: RE | Admit: 2014-07-24 | Payer: 59 | Source: Ambulatory Visit

## 2014-07-29 ENCOUNTER — Inpatient Hospital Stay (HOSPITAL_COMMUNITY): Admission: AD | Admit: 2014-07-29 | Payer: Self-pay | Source: Ambulatory Visit | Admitting: Obstetrics & Gynecology

## 2015-01-15 ENCOUNTER — Encounter (HOSPITAL_COMMUNITY): Payer: Self-pay

## 2015-05-17 ENCOUNTER — Encounter (HOSPITAL_COMMUNITY): Payer: Self-pay | Admitting: *Deleted

## 2015-08-04 ENCOUNTER — Emergency Department (HOSPITAL_BASED_OUTPATIENT_CLINIC_OR_DEPARTMENT_OTHER)
Admission: EM | Admit: 2015-08-04 | Discharge: 2015-08-04 | Disposition: A | Discharge status: Home / Self Care | Payer: BLUE CROSS/BLUE SHIELD | Attending: Emergency Medicine | Admitting: Emergency Medicine

## 2015-08-04 ENCOUNTER — Encounter (HOSPITAL_BASED_OUTPATIENT_CLINIC_OR_DEPARTMENT_OTHER): Payer: Self-pay | Admitting: *Deleted

## 2015-08-04 DIAGNOSIS — E119 Type 2 diabetes mellitus without complications: Secondary | ICD-10-CM | POA: Insufficient documentation

## 2015-08-04 DIAGNOSIS — R739 Hyperglycemia, unspecified: Secondary | ICD-10-CM | POA: Diagnosis present

## 2015-08-04 DIAGNOSIS — R634 Abnormal weight loss: Secondary | ICD-10-CM | POA: Insufficient documentation

## 2015-08-04 DIAGNOSIS — Z8632 Personal history of gestational diabetes: Secondary | ICD-10-CM | POA: Diagnosis not present

## 2015-08-04 DIAGNOSIS — Z3202 Encounter for pregnancy test, result negative: Secondary | ICD-10-CM | POA: Diagnosis not present

## 2015-08-04 DIAGNOSIS — Z79899 Other long term (current) drug therapy: Secondary | ICD-10-CM | POA: Diagnosis not present

## 2015-08-04 LAB — URINALYSIS, ROUTINE W REFLEX MICROSCOPIC
Bilirubin Urine: NEGATIVE
Leukocytes, UA: NEGATIVE
Nitrite: NEGATIVE
PH: 5 (ref 5.0–8.0)
PROTEIN: NEGATIVE mg/dL
Specific Gravity, Urine: 1.028 (ref 1.005–1.030)

## 2015-08-04 LAB — BASIC METABOLIC PANEL
ANION GAP: 14 (ref 5–15)
BUN: 11 mg/dL (ref 6–20)
CHLORIDE: 98 mmol/L — AB (ref 101–111)
CO2: 23 mmol/L (ref 22–32)
Calcium: 9.4 mg/dL (ref 8.9–10.3)
Creatinine, Ser: 0.7 mg/dL (ref 0.44–1.00)
GFR calc Af Amer: >60 mL/min (ref >60)
GFR calc non Af Amer: >60 mL/min (ref >60)
Glucose, Bld: 376 mg/dL — ABNORMAL HIGH (ref 65–99)
POTASSIUM: 3.7 mmol/L (ref 3.5–5.1)
SODIUM: 135 mmol/L (ref 135–145)

## 2015-08-04 LAB — URINE MICROSCOPIC-ADD ON

## 2015-08-04 LAB — CBG MONITORING, ED
GLUCOSE-CAPILLARY: 288 mg/dL — AB (ref 65–99)
Glucose-Capillary: 362 mg/dL — ABNORMAL HIGH (ref 65–99)

## 2015-08-04 LAB — CBC
HCT: 43.6 % (ref 36.0–46.0)
Hemoglobin: 15 g/dL (ref 12.0–15.0)
MCH: 27.2 pg (ref 26.0–34.0)
MCHC: 34.4 g/dL (ref 30.0–36.0)
MCV: 79 fL (ref 78.0–100.0)
PLATELETS: 264 10*3/uL (ref 150–400)
RBC: 5.52 MIL/uL — ABNORMAL HIGH (ref 3.87–5.11)
RDW: 13 % (ref 11.5–15.5)
WBC: 7.8 10*3/uL (ref 4.0–10.5)

## 2015-08-04 LAB — PREGNANCY, URINE: Preg Test, Ur: NEGATIVE

## 2015-08-04 MED ORDER — METFORMIN HCL 500 MG PO TABS: 500.0000 mg | ORAL_TABLET | Freq: Two times a day (BID) | ORAL | Status: DC

## 2015-08-04 MED ORDER — INSULIN ASPART 100 UNIT/ML ~~LOC~~ SOLN
2.0000 [IU] | Freq: Once | SUBCUTANEOUS | Status: AC
  Administered 2015-08-04: 2 [IU] via SUBCUTANEOUS
  Filled 2015-08-04: qty 1

## 2015-08-04 MED ORDER — METFORMIN HCL 500 MG PO TABS
500.0000 mg | ORAL_TABLET | Freq: Once | ORAL | Status: AC
  Administered 2015-08-04: 500 mg via ORAL
  Filled 2015-08-04: qty 1

## 2015-08-04 MED ORDER — SODIUM CHLORIDE 0.9 % IV BOLUS (SEPSIS)
1000.0000 mL | Freq: Once | INTRAVENOUS | Status: AC
  Administered 2015-08-04: 1000 mL via INTRAVENOUS

## 2015-08-04 NOTE — ED Notes (Signed)
She was seen at [facility] walk in with elevated glucose. Hx of gestational diabetes. She is not pregnant. Her glucose was 413 in the office. She is having blurred vision, weight loss, headache, irregular menses.

## 2015-08-04 NOTE — ED Provider Notes (Signed)
CSN: [id number]     Arrival date & time 08/04/15  1843 History  By signing my name below, I, [name], attest that this documentation has been prepared under the direction and in the presence of Rolland PorterMark Zyire Eidson, MD. Electronically Signed: [name], ED Scribe. 08/04/2015. 7:08 PM   Chief Complaint  Patient presents with  . Hyperglycemia   The history is provided by the patient. No language interpreter was used.   Deanna Nguyen is a 30 y.o. female with history of gestational DM who presents to the Emergency Department complaining of hyperglycemia. She checked  her blood sugar at home today and it was 413. Associated symptoms include polydipsia, polyuria, weight loss, intermittent dizziness, headache, and blurred vision for 1 month. Pt denies syncope and abdominal pain. She states that she has history of gestational diabetes with her last pregnancy 1 year ago but has otherwise had no problems. Her only daily medication is oral birth control. She has no PCP.   Past Medical History  Diagnosis Date  . Gestational diabetes mellitus, antepartum     Gestational, with current pregnancy only  . Headache   . Gestational diabetes    Past Surgical History  Procedure Laterality Date  . No past surgeries     Family History  Problem Relation Age of Onset  . Hypertension Other   . Hypertension Mother   . Hypertension Father   . Hypertension Maternal Grandmother   . Hypertension Maternal Grandfather   . Hypertension Paternal Grandmother   . Hypertension Paternal Grandfather    Social History  Substance Use Topics  . Smoking status: Never Smoker   . Smokeless tobacco: None  . Alcohol Use: No   OB History    Gravida Para Term Preterm AB TAB SAB Ectopic Multiple Living   5 4 3 1  0 0 0 0  3     Review of Systems  Constitutional: Positive for unexpected weight change. Negative for fever, chills, diaphoresis, appetite change and fatigue.  HENT: Negative for mouth sores, sore throat  and trouble swallowing.   Eyes: Positive for visual disturbance.  Respiratory: Negative for cough, chest tightness, shortness of breath and wheezing.   Cardiovascular: Negative for chest pain.  Gastrointestinal: Negative for nausea, vomiting, abdominal pain, diarrhea and abdominal distention.  Endocrine: Positive for polydipsia and polyuria. Negative for polyphagia.  Genitourinary: Negative for dysuria, frequency and hematuria.  Musculoskeletal: Negative for gait problem.  Skin: Negative for color change, pallor and rash.  Neurological: Positive for dizziness and headaches. Negative for syncope and light-headedness.  Hematological: Does not bruise/bleed easily.  Psychiatric/Behavioral: Negative for behavioral problems and confusion.   Allergies  Shellfish allergy and Shrimp  Home Medications   Prior to Admission medications   Medication Sig Start Date End Date Taking? Authorizing Provider  ferrous sulfate 325 (65 FE) MG tablet Take 1 tablet (325 mg total) by mouth 2 (two) times daily with a meal. 07/23/14   Raelyn Moraolitta Dawson, CNM  ibuprofen (ADVIL,MOTRIN) 600 MG tablet Take 1 tablet (600 mg total) by mouth every 6 (six) hours. 07/23/14   Raelyn Moraolitta Dawson, CNM  metFORMIN (GLUCOPHAGE) 500 MG tablet Take 1 tablet (500 mg total) by mouth 2 (two) times daily with a meal. 08/04/15   Rolland PorterMark Darthy Manganelli, MD  Prenatal Multivit-Min-Fe-FA (PRENATAL VITAMINS PO) Take 1 each by mouth daily.    Historical Provider, MD  Prenatal Vit-Fe Fumarate-FA (PRENATAL MULTIVITAMIN) TABS tablet Take 1 tablet by mouth daily at 12 noon.    Historical Provider, MD  BP 130/89 mmHg  Pulse 78  Temp(Src) 98.2 F (36.8 C) (Oral)  Resp 16  SpO2 100%  LMP 07/23/2015 Physical Exam  Constitutional: She is oriented to person, place, and time. She appears well-developed and well-nourished. No distress.  HENT:  Head: Normocephalic.  Mouth/Throat: Mucous membranes are dry.  Eyes: Conjunctivae are normal. Pupils are equal, round, and  reactive to light. No scleral icterus.  Neck: Normal range of motion. Neck supple. No thyromegaly present.  Cardiovascular: Normal rate and regular rhythm.  Exam reveals no gallop and no friction rub.   No murmur heard. Pulmonary/Chest: Effort normal and breath sounds normal. No respiratory distress. She has no wheezes. She has no rales.  Abdominal: Soft. Bowel sounds are normal. She exhibits no distension. There is no tenderness. There is no rebound.  Musculoskeletal: Normal range of motion.  Neurological: She is alert and oriented to person, place, and time.  Skin: Skin is warm and dry. No rash noted.  Psychiatric: She has a normal mood and affect. Her behavior is normal.    ED Course  Procedures (including critical care time) DIAGNOSTIC STUDIES: Oxygen Saturation is 100% on RA,  normal by my interpretation.    COORDINATION OF CARE: 7:05 PM Discussed treatment plan which includes lab work, insulin, and IVF with pt at bedside and pt agreed to plan.  Labs Review Labs Reviewed  URINALYSIS, ROUTINE W REFLEX MICROSCOPIC (NOT AT [facility]) - Abnormal; Notable for the following:    Glucose, UA >1000 (*)    Hgb urine dipstick TRACE (*)    Ketones, ur >80 (*)    All other components within normal limits  BASIC METABOLIC PANEL - Abnormal; Notable for the following:    Chloride 98 (*)    Glucose, Bld 376 (*)    All other components within normal limits  CBC - Abnormal; Notable for the following:    RBC 5.52 (*)    All other components within normal limits  URINE MICROSCOPIC-ADD ON - Abnormal; Notable for the following:    Squamous Epithelial / LPF 0-5 (*)    Bacteria, UA RARE (*)    All other components within normal limits  CBG MONITORING, ED - Abnormal; Notable for the following:    Glucose-Capillary 362 (*)    All other components within normal limits  CBG MONITORING, ED - Abnormal; Notable for the following:    Glucose-Capillary 288 (*)    All other components within normal limits   PREGNANCY, URINE    Imaging Review No results found. I have personally reviewed and evaluated these lab results as part of my medical decision-making.   EKG Interpretation None      MDM   Final diagnoses:  Type 2 diabetes mellitus without complication, without long-term current use of insulin (HCC)    Sugar improved to 288. Plan is home. Continue hydration. Avoid simple sugars. Primary care follow-up. Prescription Glucophage.   I personally performed the services described in this documentation, which was scribed in my presence. The recorded information has been reviewed and is accurate.    Rolland Porter, MD 08/04/15 2025

## 2015-08-04 NOTE — Discharge Instructions (Signed)
Check and record your blood sugar each day. Follow-up with primary care physician within one month for additional testing, and to review your response to medication.   Blood Glucose Monitoring, Adult Monitoring your blood glucose (also know as blood sugar) helps you to manage your diabetes. It also helps you and your health care provider monitor your diabetes and determine how well your treatment plan is working. WHY SHOULD YOU MONITOR YOUR BLOOD GLUCOSE?  It can help you understand how food, exercise, and medicine affect your blood glucose.  It allows you to know what your blood glucose is at any given moment. You can quickly tell if you are having low blood glucose (hypoglycemia) or high blood glucose (hyperglycemia).  It can help you and your health care provider know how to adjust your medicines.  It can help you understand how to manage an illness or adjust medicine for exercise. WHEN SHOULD YOU TEST? Your health care provider will help you decide how often you should check your blood glucose. This may depend on the type of diabetes you have, your diabetes control, or the types of medicines you are taking. Be sure to write down all of your blood glucose readings so that this information can be reviewed with your health care provider. See below for examples of testing times that your health care provider may suggest. Type 1 Diabetes  Test at least 2 times per day if your diabetes is well controlled, if you are using an insulin pump, or if you perform multiple daily injections.  If your diabetes is not well controlled or if you are sick, you may need to test more often.  It is a good idea to also test:  Before every insulin injection.  Before and after exercise.  Between meals and 2 hours after a meal.  Occasionally between 2:00 a.m. and 3:00 a.m. Type 2 Diabetes  If you are taking insulin, test at least 2 times per day. However, it is best to test before every insulin  injection.  If you take medicines by mouth (orally), test 2 times a day.  If you are on a controlled diet, test once a day.  If your diabetes is not well controlled or if you are sick, you may need to monitor more often. HOW TO MONITOR YOUR BLOOD GLUCOSE Supplies Needed  Blood glucose meter.  Test strips for your meter. Each meter has its own strips. You must use the strips that go with your own meter.  A pricking needle (lancet).  A device that holds the lancet (lancing device).  A journal or log book to write down your results. Procedure  Wash your hands with soap and water. Alcohol is not preferred.  Prick the side of your finger (not the tip) with the lancet.  Gently milk the finger until a small drop of blood appears.  Follow the instructions that come with your meter for inserting the test strip, applying blood to the strip, and using your blood glucose meter. Other Areas to Get Blood for Testing Some meters allow you to use other areas of your body (other than your finger) to test your blood. These areas are called alternative sites. The most common alternative sites are:  The forearm.  The thigh.  The back area of the lower leg.  The palm of the hand. The blood flow in these areas is slower. Therefore, the blood glucose values you get may be delayed, and the numbers are different from what you would get  from your fingers. Do not use alternative sites if you think you are having hypoglycemia. Your reading will not be accurate. Always use a finger if you are having hypoglycemia. Also, if you cannot feel your lows (hypoglycemia unawareness), always use your fingers for your blood glucose checks. ADDITIONAL TIPS FOR GLUCOSE MONITORING  Do not reuse lancets.  Always carry your supplies with you.  All blood glucose meters have a 24-hour "hotline" number to call if you have questions or need help.  Adjust (calibrate) your blood glucose meter with a control solution  after finishing a few boxes of strips. BLOOD GLUCOSE RECORD KEEPING It is a good idea to keep a daily record or log of your blood glucose readings. Most glucose meters, if not all, keep your glucose records stored in the meter. Some meters come with the ability to download your records to your home computer. Keeping a record of your blood glucose readings is especially helpful if you are wanting to look for patterns. Make notes to go along with the blood glucose readings because you might forget what happened at that exact time. Keeping good records helps you and your health care provider to work together to achieve good diabetes management.    This information is not intended to replace advice given to you by your health care provider. Make sure you discuss any questions you have with your health care provider.   Document Released: 05/12/2003 Document Revised: 05/30/2014 Document Reviewed: 10/01/2012 Elsevier Interactive Patient Education 2016 [employer]  Hyperglycemia Hyperglycemia occurs when the glucose (sugar) in your blood is too high. Hyperglycemia can happen for many reasons, but it most often happens to people who do not know they have diabetes or are not managing their diabetes properly.  CAUSES  Whether you have diabetes or not, there are other causes of hyperglycemia. Hyperglycemia can occur when you have diabetes, but it can also occur in other situations that you might not be as aware of, such as: Diabetes  If you have diabetes and are having problems controlling your blood glucose, hyperglycemia could occur because of some of the following reasons:  Not following your meal plan.  Not taking your diabetes medications or not taking it properly.  Exercising less or doing less activity than you normally do.  Being sick. Pre-diabetes  This cannot be ignored. Before people develop Type 2 diabetes, they almost always have "pre-diabetes." This is when your blood glucose levels  are higher than normal, but not yet high enough to be diagnosed as diabetes. Research has shown that some long-term damage to the body, especially the heart and circulatory system, may already be occurring during pre-diabetes. If you take action to manage your blood glucose when you have pre-diabetes, you may delay or prevent Type 2 diabetes from developing. Stress  If you have diabetes, you may be "diet" controlled or on oral medications or insulin to control your diabetes. However, you may find that your blood glucose is higher than usual in the hospital whether you have diabetes or not. This is often referred to as "stress hyperglycemia." Stress can elevate your blood glucose. This happens because of hormones put out by the body during times of stress. If stress has been the cause of your high blood glucose, it can be followed regularly by your caregiver. That way he/she can make sure your hyperglycemia does not continue to get worse or progress to diabetes. Steroids  Steroids are medications that act on the infection fighting system (immune system)  to block inflammation or infection. One side effect can be a rise in blood glucose. Most people can produce enough extra insulin to allow for this rise, but for those who cannot, steroids make blood glucose levels go even higher. It is not unusual for steroid treatments to "uncover" diabetes that is developing. It is not always possible to determine if the hyperglycemia will go away after the steroids are stopped. A special blood test called an A1c is sometimes done to determine if your blood glucose was elevated before the steroids were started. SYMPTOMS  Thirsty.  Frequent urination.  Dry mouth.  Blurred vision.  Tired or fatigue.  Weakness.  Sleepy.  Tingling in feet or leg. DIAGNOSIS  Diagnosis is made by monitoring blood glucose in one or all of the following ways:  A1c test. This is a chemical found in your blood.  Fingerstick blood  glucose monitoring.  Laboratory results. TREATMENT  First, knowing the cause of the hyperglycemia is important before the hyperglycemia can be treated. Treatment may include, but is not be limited to:  Education.  Change or adjustment in medications.  Change or adjustment in meal plan.  Treatment for an illness, infection, etc.  More frequent blood glucose monitoring.  Change in exercise plan.  Decreasing or stopping steroids.  Lifestyle changes. HOME CARE INSTRUCTIONS   Test your blood glucose as directed.  Exercise regularly. Your caregiver will give you instructions about exercise. Pre-diabetes or diabetes which comes on with stress is helped by exercising.  Eat wholesome, balanced meals. Eat often and at regular, fixed times. Your caregiver or nutritionist will give you a meal plan to guide your sugar intake.  Being at an ideal weight is important. If needed, losing as little as 10 to 15 pounds may help improve blood glucose levels. SEEK MEDICAL CARE IF:   You have questions about medicine, activity, or diet.  You continue to have symptoms (problems such as increased thirst, urination, or weight gain). SEEK IMMEDIATE MEDICAL CARE IF:   You are vomiting or have diarrhea.  Your breath smells fruity.  You are breathing faster or slower.  You are very sleepy or incoherent.  You have numbness, tingling, or pain in your feet or hands.  You have chest pain.  Your symptoms get worse even though you have been following your caregiver's orders.  If you have any other questions or concerns.   This information is not intended to replace advice given to you by your health care provider. Make sure you discuss any questions you have with your health care provider.   Document Released: 11/02/2000 Document Revised: 08/01/2011 Document Reviewed: 01/13/2015 Elsevier Interactive Patient Education 2016 [employer] Inc[employer]  Type 2 Diabetes Mellitus, Adult Type 2 diabetes mellitus  is a long-term (chronic) disease. In type 2 diabetes:  The pancreas does not make enough of a hormone called insulin.  The cells in the body do not respond as well to the insulin that is made.  Both of the above can happen. Normally, insulin moves sugars from food into tissue cells. This gives you energy. If you have type 2 diabetes, sugars cannot be moved into tissue cells. This causes high blood sugar (hyperglycemia).  Your doctors will set personal treatment goals for you based on your age, your medicines, how long you have had diabetes, and any other medical conditions you have. Generally, the goal of treatment is to maintain the following blood glucose levels:  Before meals (preprandial): 80-130 mg/dL.  After meals (postprandial): below  180 mg/dL.  A1c: less than 6.5-7%. HOME CARE  Have your hemoglobin A1c level checked twice a year. The level shows if your diabetes is under control or out of control.  Test your blood sugar level every day as told by your doctor.  Check your ketone levels by testing your pee (urine) when you are sick and as told.  Take your diabetes or insulin medicine as told by your doctor.  Never run out of insulin.  Adjust how much insulin you give yourself based on how many carbs (carbohydrates) you eat. Carbs are in many foods, such as fruits, vegetables, whole grains, and dairy products.  Have a healthy snack between every healthy meal. Have 3 meals and 3 snacks a day.  Lose weight if you are overweight.  Carry a medical alert card or wear your medical alert jewelry.  Carry a 15-gram carb snack with you at all times. Examples include:  Glucose pills, 3 or 4.  Glucose gel, 15-gram tube.  Raisins, 2 tablespoons (24 grams).  Jelly beans, 6.  Animal crackers, 8.  Regular (not diet) pop, 4 ounces (120 milliliters).  Gummy treats, 9.  Notice low blood sugar (hypoglycemia) symptoms, such as:  Shaking (tremors).  Trouble thinking  clearly.  Sweating.  Faster heart rate.  Headache.  Dry mouth.  Hunger.  Crabbiness (irritability).  Being worried or tense (anxious).  Restless sleep.  A change in speech or coordination.  Confusion.  Treat low blood sugar right away. If you are alert and can swallow, follow the 15:15 rule:  Take 15-20 grams of a rapid-acting glucose or carb. This includes glucose gel, glucose pills, or 4 ounces (120 milliliters) of fruit juice, regular pop, or low-fat milk.  Check your blood sugar level 15 minutes after taking the glucose.  Take 15-20 grams more of glucose if the repeat blood sugar level is still 70 mg/dL (milligrams/deciliter) or below.  Eat a meal or snack within 1 hour of the blood sugar levels going back to normal.  Notice early symptoms of high blood sugar, such as:  Being really thirsty or drinking a lot (polydipsia).  Peeing a lot (polyuria).  Do at least 150 minutes of physical activity a week or as told.  Split the 150 minutes of activity up during the week. Do not do 150 minutes of activity in one day.  Perform exercises, such as weight lifting, at least 2 times a week or as told.  Spend no more than 90 minutes at one time inactive.  Adjust your insulin or food intake as needed if you start a new exercise or sport.  Follow your sick-day plan when you are not able to eat or drink as usual.  Do not smoke, chew tobacco, or use electronic cigarettes.  Women who are not pregnant should drink no more than 1 drink a day. Men should drink no more than 2 drinks a day.  Only drink alcohol with food.  Ask your doctor if alcohol is safe for you.  Tell your doctor if you drink alcohol several times during the week.  See your doctor regularly.  Schedule an eye exam soon after you are told you have diabetes. Schedule exams once every year.  Check your skin and feet every day. Check for cuts, bruises, redness, nail problems, bleeding, blisters, or sores. A  doctor should do a foot exam once a year.  Brush your teeth and gums twice a day. Floss once a day. Visit your dentist regularly.  Share  your diabetes plan with your workplace or school.  Keep your shots that fight diseases (vaccines) up to date.  Get a flu (influenza) shot every year.  Get a pneumonia shot. If you are 68 years of age or older and you have never gotten a pneumonia shot, you might need to get two shots.  Ask your doctor which other shots you should get.  Learn how to deal with stress.  Get diabetes education and support as needed.  Ask your doctor for special help if:  You need help to maintain or improve how you do things on your own.  You need help to maintain or improve the quality of your life.  You have foot or hand problems.  You have trouble cleaning yourself, dressing, eating, or doing physical activity. GET HELP IF:  You are unable to eat or drink for more than 6 hours.  You feel sick to your stomach (nauseous) or throw up (vomit) for more than 6 hours.  Your blood sugar level is over 240 mg/dL.  There is a change in mental status.  You get another serious illness.  You have watery poop (diarrhea) for more than 6 hours.  You have been sick or have had a fever for 2 or more days and are not getting better.  You have pain when you are active. GET HELP RIGHT AWAY IF:  You have trouble breathing.  Your ketone levels are higher than your doctor says they should be. MAKE SURE YOU:  Understand these instructions.  Will watch your condition.  Will get help right away if you are not doing well or get worse.   This information is not intended to replace advice given to you by your health care provider. Make sure you discuss any questions you have with your health care provider.   Document Released: 02/16/2008 Document Revised: 09/23/2014 Document Reviewed: 12/09/2011 Elsevier Interactive Patient Education [employer]

## 2015-08-14 ENCOUNTER — Emergency Department (HOSPITAL_BASED_OUTPATIENT_CLINIC_OR_DEPARTMENT_OTHER)
Admission: EM | Admit: 2015-08-14 | Discharge: 2015-08-14 | Disposition: A | Discharge status: Home / Self Care | Payer: BLUE CROSS/BLUE SHIELD | Attending: Emergency Medicine | Admitting: Emergency Medicine

## 2015-08-14 ENCOUNTER — Encounter (HOSPITAL_BASED_OUTPATIENT_CLINIC_OR_DEPARTMENT_OTHER): Payer: Self-pay | Admitting: *Deleted

## 2015-08-14 ENCOUNTER — Telehealth: Payer: Self-pay | Admitting: Behavioral Health

## 2015-08-14 DIAGNOSIS — Z79899 Other long term (current) drug therapy: Secondary | ICD-10-CM | POA: Insufficient documentation

## 2015-08-14 DIAGNOSIS — Z3202 Encounter for pregnancy test, result negative: Secondary | ICD-10-CM | POA: Insufficient documentation

## 2015-08-14 DIAGNOSIS — R739 Hyperglycemia, unspecified: Secondary | ICD-10-CM | POA: Diagnosis present

## 2015-08-14 DIAGNOSIS — R1110 Vomiting, unspecified: Secondary | ICD-10-CM | POA: Insufficient documentation

## 2015-08-14 DIAGNOSIS — Z7984 Long term (current) use of oral hypoglycemic drugs: Secondary | ICD-10-CM | POA: Insufficient documentation

## 2015-08-14 DIAGNOSIS — Z8632 Personal history of gestational diabetes: Secondary | ICD-10-CM | POA: Insufficient documentation

## 2015-08-14 LAB — URINALYSIS, ROUTINE W REFLEX MICROSCOPIC
BILIRUBIN URINE: NEGATIVE
Glucose, UA: >1000 mg/dL — AB
Hgb urine dipstick: NEGATIVE
KETONES UR: 40 mg/dL — AB
Leukocytes, UA: NEGATIVE
NITRITE: NEGATIVE
Protein, ur: NEGATIVE mg/dL
SPECIFIC GRAVITY, URINE: 1.028 (ref 1.005–1.030)
pH: 5.5 (ref 5.0–8.0)

## 2015-08-14 LAB — COMPREHENSIVE METABOLIC PANEL
ALT: 11 U/L — ABNORMAL LOW (ref 14–54)
AST: 13 U/L — ABNORMAL LOW (ref 15–41)
Albumin: 4.1 g/dL (ref 3.5–5.0)
Alkaline Phosphatase: 44 U/L (ref 38–126)
Anion gap: 9 (ref 5–15)
BUN: 11 mg/dL (ref 6–20)
CO2: 27 mmol/L (ref 22–32)
Calcium: 9.3 mg/dL (ref 8.9–10.3)
Chloride: 96 mmol/L — ABNORMAL LOW (ref 101–111)
Creatinine, Ser: 0.78 mg/dL (ref 0.44–1.00)
GFR calc Af Amer: >60 mL/min (ref >60)
GFR calc non Af Amer: >60 mL/min (ref >60)
Glucose, Bld: 410 mg/dL — ABNORMAL HIGH (ref 65–99)
Potassium: 3.8 mmol/L (ref 3.5–5.1)
Sodium: 132 mmol/L — ABNORMAL LOW (ref 135–145)
Total Bilirubin: 0.7 mg/dL (ref 0.3–1.2)
Total Protein: 7.1 g/dL (ref 6.5–8.1)

## 2015-08-14 LAB — CBC WITH DIFFERENTIAL/PLATELET
Basophils Absolute: 0 10*3/uL (ref 0.0–0.1)
Basophils Relative: 0 %
Eosinophils Absolute: 0.2 10*3/uL (ref 0.0–0.7)
Eosinophils Relative: 3 %
HCT: 39.4 % (ref 36.0–46.0)
Hemoglobin: 13.3 g/dL (ref 12.0–15.0)
Lymphocytes Relative: 30 %
Lymphs Abs: 2 10*3/uL (ref 0.7–4.0)
MCH: 27.2 pg (ref 26.0–34.0)
MCHC: 33.8 g/dL (ref 30.0–36.0)
MCV: 80.6 fL (ref 78.0–100.0)
Monocytes Absolute: 0.4 10*3/uL (ref 0.1–1.0)
Monocytes Relative: 7 %
Neutro Abs: 4.1 10*3/uL (ref 1.7–7.7)
Neutrophils Relative %: 60 %
Platelets: 216 10*3/uL (ref 150–400)
RBC: 4.89 MIL/uL (ref 3.87–5.11)
RDW: 13.4 % (ref 11.5–15.5)
WBC: 6.7 10*3/uL (ref 4.0–10.5)

## 2015-08-14 LAB — URINE MICROSCOPIC-ADD ON

## 2015-08-14 LAB — PREGNANCY, URINE: Preg Test, Ur: NEGATIVE

## 2015-08-14 LAB — CBG MONITORING, ED
GLUCOSE-CAPILLARY: 410 mg/dL — AB (ref 65–99)
Glucose-Capillary: 217 mg/dL — ABNORMAL HIGH (ref 65–99)

## 2015-08-14 MED ORDER — INSULIN ASPART 100 UNIT/ML ~~LOC~~ SOLN
5.0000 [IU] | Freq: Once | SUBCUTANEOUS | Status: AC
  Administered 2015-08-14: 5 [IU] via INTRAVENOUS
  Filled 2015-08-14: qty 1

## 2015-08-14 MED ORDER — SODIUM CHLORIDE 0.9 % IV BOLUS (SEPSIS)
1000.0000 mL | Freq: Once | INTRAVENOUS | Status: AC
  Administered 2015-08-14: 1000 mL via INTRAVENOUS

## 2015-08-14 NOTE — ED Notes (Addendum)
Pt reports hyperglycemia today.  392 PTA.  States that it has been >400 consistently over the past few weeks.  Pt A/O, denies pain.  Reports vision change over the past 5 days.

## 2015-08-14 NOTE — Telephone Encounter (Signed)
Unable to reach patient at time of Pre-Visit Call.  Left message for patient to return call when available.    

## 2015-08-14 NOTE — ED Provider Notes (Signed)
CSN: [id number]     Arrival date & time 08/14/15  1952 History   First MD Initiated Contact with Patient 08/14/15 2033     Chief Complaint  Patient presents with  . Hyperglycemia    HPI   30 year old female presents today with complaints of hyperglycemia. Patient was mistreated seen on 08/04/2015 for similar presentation. Patient reports she was diagnosed with additional diabetes, but did not monitor her blood sugar follow-up with any health care provider. She reports that after her visit on the 14th she started taking metformin, had 2 days of vomiting, with no significant side effects. Patient reports that over the last several weeks she has been consistently over 400, notes that she was 392 prior to arrival. Patient reports that over the last several days her vision has become blurry, so is consistent with previous episodes of elevated CBG. Patient denies any recent illnesses, fever, chills, nausea, vomiting.   Past Medical History  Diagnosis Date  . Gestational diabetes mellitus, antepartum     Gestational, with current pregnancy only  . Headache   . Gestational diabetes    Past Surgical History  Procedure Laterality Date  . No past surgeries     Family History  Problem Relation Age of Onset  . Hypertension Other   . Hypertension Mother   . Hypertension Father   . Hypertension Maternal Grandmother   . Hypertension Maternal Grandfather   . Hypertension Paternal Grandmother   . Hypertension Paternal Grandfather    Social History  Substance Use Topics  . Smoking status: Never Smoker   . Smokeless tobacco: None  . Alcohol Use: No   OB History    Gravida Para Term Preterm AB TAB SAB Ectopic Multiple Living   0 0 0 0  3     Review of Systems  All other systems reviewed and are negative.   Allergies  Shellfish allergy and Shrimp  Home Medications   Prior to Admission medications   Medication Sig Start Date End Date Taking? Authorizing Provider  ferrous  sulfate 325 (65 FE) MG tablet Take 1 tablet (325 mg total) by mouth 2 (two) times daily with a meal. 07/23/14   Raelyn Mora, CNM  ibuprofen (ADVIL,MOTRIN) 600 MG tablet Take 1 tablet (600 mg total) by mouth every 6 (six) hours. 07/23/14   Raelyn Mora, CNM  metFORMIN (GLUCOPHAGE) 500 MG tablet Take 1 tablet (500 mg total) by mouth 2 (two) times daily with a meal. 08/04/15   Rolland Porter, MD  Prenatal Multivit-Min-Fe-FA (PRENATAL VITAMINS PO) Take 1 each by mouth daily.    Historical Provider, MD  Prenatal Vit-Fe Fumarate-FA (PRENATAL MULTIVITAMIN) TABS tablet Take 1 tablet by mouth daily at 12 noon.    Historical Provider, MD   BP 111/83 mmHg  Pulse 86  Temp(Src) 99.1 F (37.3 C) (Oral)  Resp 16  Ht  (1.575 m)  Wt 51.909 kg  BMI 20.93 kg/m2  SpO2 100%  LMP 07/23/2015   Physical Exam  Constitutional: She is oriented to person, place, and time. She appears well-developed and well-nourished.  HENT:  Head: Normocephalic and atraumatic.  Eyes: Conjunctivae are normal. Pupils are equal, round, and reactive to light. Right eye exhibits no discharge. Left eye exhibits no discharge. No scleral icterus.  Neck: Normal range of motion. No JVD present. No tracheal deviation present.  Pulmonary/Chest: Effort normal. No stridor.  Neurological: She is alert and oriented to person, place, and time. Coordination normal.  Skin: Skin is  warm and dry. No rash noted. No erythema. No pallor.  Psychiatric: She has a normal mood and affect. Her behavior is normal. Judgment and thought content normal.  Nursing note and vitals reviewed.   ED Course  Procedures (including critical care time) Labs Review Labs Reviewed  URINALYSIS, ROUTINE W REFLEX MICROSCOPIC (NOT [facility]) - Abnormal; Notable for the following:    Glucose, UA >1000 (*)    Ketones, ur 40 (*)    All other components within normal limits  URINE MICROSCOPIC-ADD ON - Abnormal; Notable for the following:    Squamous Epithelial / LPF 0-5 (*)     Bacteria, UA RARE (*)    All other components within normal limits  COMPREHENSIVE METABOLIC PANEL - Abnormal; Notable for the following:    Sodium 132 (*)    Chloride 96 (*)    Glucose, Bld 410 (*)    AST 13 (*)    ALT 11 (*)    All other components within normal limits  CBG MONITORING, ED - Abnormal; Notable for the following:    Glucose-Capillary 410 (*)    All other components within normal limits  CBG MONITORING, ED - Abnormal; Notable for the following:    Glucose-Capillary 217 (*)    All other components within normal limits  PREGNANCY, URINE  CBC WITH DIFFERENTIAL/PLATELET    Imaging Review No results found. I have personally reviewed and evaluated these images and lab results as part of my medical decision-making.   EKG Interpretation None      MDM   Final diagnoses:  Hyperglycemia    Labs:  Imaging:  Consults:  Therapeutics:  Discharge Meds:   Assessment/Plan:30 year old female presents today with complaints of hyperglycemia. Patient reports a history of gestational diabetes, notes several weeks of elevated blood sugar. Patient here today reporting some blurred vision, denies headache, no neurological deficits. Patient's sugar significant elevated decreased tear with normal saline and insulin. Patient has no signs of DKA or hyperosmolar state. She is instructed to continues metformin, she has follow-up evaluation with primary care in 2 days. She is given extensive counseling on food choices, exercise, blood sugar monitoring. She is given strict preterm percussion even worsening signs or symptoms present. Patient verbalized understanding and agreement today's plan and had no further questions or concerns temp discharge        Eyvonne MechanicJeffrey Delcia Spitzley, PA-C 08/14/15 2328  Loren Raceravid Yelverton, MD 09/01/15 [phone]

## 2015-08-14 NOTE — ED Notes (Signed)
CBG 410 °

## 2015-08-14 NOTE — Discharge Instructions (Signed)
Please follow-up with your primary care provider on Monday as previously scheduled. If any new or worsening signs or symptoms present please return to the emergency room immediately.  Blood Glucose Monitoring, Adult Monitoring your blood glucose (also know as blood sugar) helps you to manage your diabetes. It also helps you and your health care provider monitor your diabetes and determine how well your treatment plan is working. WHY SHOULD YOU MONITOR YOUR BLOOD GLUCOSE?  It can help you understand how food, exercise, and medicine affect your blood glucose.  It allows you to know what your blood glucose is at any given moment. You can quickly tell if you are having low blood glucose (hypoglycemia) or high blood glucose (hyperglycemia).  It can help you and your health care provider know how to adjust your medicines.  It can help you understand how to manage an illness or adjust medicine for exercise. WHEN SHOULD YOU TEST? Your health care provider will help you decide how often you should check your blood glucose. This may depend on the type of diabetes you have, your diabetes control, or the types of medicines you are taking. Be sure to write down all of your blood glucose readings so that this information can be reviewed with your health care provider. See below for examples of testing times that your health care provider may suggest. Type 1 Diabetes  Test at least 2 times per day if your diabetes is well controlled, if you are using an insulin pump, or if you perform multiple daily injections.  If your diabetes is not well controlled or if you are sick, you may need to test more often.  It is a good idea to also test:  Before every insulin injection.  Before and after exercise.  Between meals and 2 hours after a meal.  Occasionally between 2:00 a.m. and 3:00 a.m. Type 2 Diabetes  If you are taking insulin, test at least 2 times per day. However, it is best to test before every  insulin injection.  If you take medicines by mouth (orally), test 2 times a day.  If you are on a controlled diet, test once a day.  If your diabetes is not well controlled or if you are sick, you may need to monitor more often. HOW TO MONITOR YOUR BLOOD GLUCOSE Supplies Needed  Blood glucose meter.  Test strips for your meter. Each meter has its own strips. You must use the strips that go with your own meter.  A pricking needle (lancet).  A device that holds the lancet (lancing device).  A journal or log book to write down your results. Procedure  Wash your hands with soap and water. Alcohol is not preferred.  Prick the side of your finger (not the tip) with the lancet.  Gently milk the finger until a small drop of blood appears.  Follow the instructions that come with your meter for inserting the test strip, applying blood to the strip, and using your blood glucose meter. Other Areas to Get Blood for Testing Some meters allow you to use other areas of your body (other than your finger) to test your blood. These areas are called alternative sites. The most common alternative sites are:  The forearm.  The thigh.  The back area of the lower leg.  The palm of the hand. The blood flow in these areas is slower. Therefore, the blood glucose values you get may be delayed, and the numbers are different from what you would  get from your fingers. Do not use alternative sites if you think you are having hypoglycemia. Your reading will not be accurate. Always use a finger if you are having hypoglycemia. Also, if you cannot feel your lows (hypoglycemia unawareness), always use your fingers for your blood glucose checks. ADDITIONAL TIPS FOR GLUCOSE MONITORING  Do not reuse lancets.  Always carry your supplies with you.  All blood glucose meters have a 24-hour "hotline" number to call if you have questions or need help.  Adjust (calibrate) your blood glucose meter with a control  solution after finishing a few boxes of strips. BLOOD GLUCOSE RECORD KEEPING It is a good idea to keep a daily record or log of your blood glucose readings. Most glucose meters, if not all, keep your glucose records stored in the meter. Some meters come with the ability to download your records to your home computer. Keeping a record of your blood glucose readings is especially helpful if you are wanting to look for patterns. Make notes to go along with the blood glucose readings because you might forget what happened at that exact time. Keeping good records helps you and your health care provider to work together to achieve good diabetes management.    This information is not intended to replace advice given to you by your health care provider. Make sure you discuss any questions you have with your health care provider.   Document Released: 05/12/2003 Document Revised: 05/30/2014 Document Reviewed: 10/01/2012 Elsevier Interactive Patient Education [employer]2016 Elsevier Inc.

## 2015-08-17 ENCOUNTER — Ambulatory Visit (INDEPENDENT_AMBULATORY_CARE_PROVIDER_SITE_OTHER): Payer: BLUE CROSS/BLUE SHIELD | Admitting: Family Medicine

## 2015-08-17 ENCOUNTER — Encounter: Payer: Self-pay | Admitting: Family Medicine

## 2015-08-17 VITALS — BP 112/72 | HR 94 | Temp 98.6°F | Ht 62.0 in | Wt 115.8 lb

## 2015-08-17 DIAGNOSIS — E119 Type 2 diabetes mellitus without complications: Secondary | ICD-10-CM | POA: Diagnosis not present

## 2015-08-17 DIAGNOSIS — R634 Abnormal weight loss: Secondary | ICD-10-CM

## 2015-08-17 DIAGNOSIS — H538 Other visual disturbances: Secondary | ICD-10-CM | POA: Diagnosis not present

## 2015-08-17 DIAGNOSIS — IMO0001 Reserved for inherently not codable concepts without codable children: Secondary | ICD-10-CM | POA: Insufficient documentation

## 2015-08-17 DIAGNOSIS — Z794 Long term (current) use of insulin: Secondary | ICD-10-CM | POA: Diagnosis not present

## 2015-08-17 MED ORDER — GLUCOSE BLOOD VI STRP: ORAL_STRIP | Status: DC

## 2015-08-17 MED ORDER — INSULIN GLARGINE 300 UNIT/ML ~~LOC~~ SOPN: 10.0000 [IU] | PEN_INJECTOR | Freq: Every day | SUBCUTANEOUS | Status: DC

## 2015-08-17 MED FILL — ULTICARE PEN NDL 8MM 31G: 31G X 8 MM | 90 days supply | Qty: 100 | Fill #0

## 2015-08-17 MED FILL — TOUJEO SOLOSTAR 300 UNITS/M: 300 | 30 days supply | Qty: 5 | Fill #0

## 2015-08-17 NOTE — Patient Instructions (Addendum)
You do have diabetes which will probably need long term insulin (instead of just oral medications).   I am going to refer you to an endocrinologist to help us manage your diabetes but for now we will start a once a day insulin  (toujeo pen) Start with 10 units daily As long as your morning fasting glucose is greater than 200, increase by 2 units every 2 days.  Please start gathering data about your daily blood sugars- check it 2-3x daily and keep a written record. If you have any concerns or feel like you are getting worse please call me!  For the time being eat minimal sugars, less carbs (such as rice, bread, pasta) and more protein/ vegetables.

## 2015-08-17 NOTE — Progress Notes (Signed)
[facility] [address], [address] [place], [place] [address] [phone] [phone]  Date:  08/17/2015   Name:  Deanna Nguyen   DOB:  [birth year]   MRN:  [id number]  PCP:  Abbe AmsterdamOPLAND,JESSICA, MD    Chief Complaint: New Patient (Initial Visit)   History of Present Illness:  Deanna Nguyen is a 30 y.o. very pleasant female patient who presents with the following:  Here today to establish care. History of GDM- she was in the ER a week ago with sx of hyperglycemia and was found to have gluocose over 400.  She was started on metformin per the ER.    She was first Dx with DM when she was pregnant with her daughter last year.  She delivered about a year ago and had not continued to follow-up for DM.   She first noted sx that made her think that her glucose might be high again about 2 months ago.   She noted polydipsiam oolyuria, blurred vision, fatigue and she has lost about 20 lbs  No family history of DM.  She does know how to check her sugars and is doing so at home- over 400 this am She used only metfomrin during her DM  Per most recent CMP her AG was 9  Glucose 386 here today  She has 2 young daughters.  Here today with her husband who is supportive and concerned.  She has otherwise been in good health  No results found for: HGBA1C   Patient Active Problem List   Diagnosis Date Noted  . Postpartum care following vacuum-assisted vaginal delivery (3/1) 07/22/2014  . Cephalic version 88/41/660602/20/2016    Past Medical History  Diagnosis Date  . Gestational diabetes mellitus, antepartum     Gestational, with current pregnancy only  . Headache   . Gestational diabetes     Past Surgical History  Procedure Laterality Date  . No past surgeries      Social History  Substance Use Topics  . Smoking status: Never Smoker   . Smokeless tobacco: Never Used  . Alcohol Use: No    Family History  Problem Relation Age of Onset  .  Hypertension Other   . Hypertension Mother   . Hypertension Father   . Hypertension Maternal Grandmother   . Hypertension Maternal Grandfather   . Hypertension Paternal Grandmother   . Hypertension Paternal Grandfather     Allergies  Allergen Reactions  . Shellfish Allergy Itching and Swelling  . Shrimp [Shellfish Allergy] Shortness Of Breath    "Feels like my throat is closing up".    Medication list has been reviewed and updated.  Current Outpatient Prescriptions on File Prior to Visit  Medication Sig Dispense Refill  . ibuprofen (ADVIL,MOTRIN) 600 MG tablet Take 1 tablet (600 mg total) by mouth every 6 (six) hours. 30 tablet 0  . metFORMIN (GLUCOPHAGE) 500 MG tablet Take 1 tablet (500 mg total) by mouth 2 (two) times daily with a meal. 60 tablet 0   No current facility-administered medications on file prior to visit.    Review of Systems:  As per HPI- otherwise negative.   Physical Examination: Filed Vitals:   08/17/15 1317  BP: 112/72  Pulse: 94  Temp: 98.6 F (37 C)   Filed Vitals:   08/17/15 1317  Height: 5\' 2"  (1.575 m)  Weight: 115 lb 12.8 oz (52.527 kg)   Body mass index is 21.17 kg/(m^2). Ideal Body Weight: Weight in (  lb) to have BMI = 25: 136.4  GEN: WDWN, NAD, Non-toxic, A & O x 3, looks well, slim build HEENT: Atraumatic, Normocephalic. Neck supple. No masses, No LAD. Ears and Nose: No external deformity. CV: RRR, No M/G/R. No JVD. No thrill. No extra heart sounds. PULM: CTA B, no wheezes, crackles, rhonchi. No retractions. No resp. distress. No accessory muscle use. ABD: S, NT, ND. No rebound. No HSM. EXTR: No c/c/e NEURO Normal gait.  PSYCH: Normally interactive. Conversant. Not depressed or anxious appearing.  Calm demeanor.     Assessment and Plan: IDDM (insulin dependent diabetes mellitus) (HCC) - Plan: POCT glucose (manual entry), Insulin Glargine (TOUJEO SOLOSTAR) 300 UNIT/ML SOPN, Hemoglobin A1c, glucose blood test strip, Ambulatory  referral to Endocrinology, DISCONTINUED: Insulin Glargine (TOUJEO SOLOSTAR) 300 UNIT/ML SOPN  Blurred vision - Plan: Ambulatory referral to Endocrinology  Loss of weight - Plan: Ambulatory referral to Endocrinology  Here today with DM- ?if she has some characteristics of type 1 given rapid worsening and slim body type.  Recent CMP showed that she was well compensated.  Will start on long acting insulin today and arrange endocrinology follow-up asap.   Ordered A1c today also  Pt underwent nursing teaching with toujeo pen today.  Gave her 10 units and discussed titration  Meds ordered this encounter  Medications  . APRI 0.15-30 MG-MCG tablet    Sig: Take 1 tablet by mouth daily.    Refill:  8  . DISCONTD: Insulin Glargine (TOUJEO SOLOSTAR) 300 UNIT/ML SOPN    Sig: Inject 10 Units into the skin daily. Increase as directed    Dispense:  4.5 mL    Refill:  3  . Insulin Glargine (TOUJEO SOLOSTAR) 300 UNIT/ML SOPN    Sig: Inject 10 Units into the skin daily. Increase as directed    Dispense:  4.5 mL    Refill:  3  . glucose blood test strip    Sig: Use to test glucose as needed.  Pt uses the 1 touch ultra strips    Dispense:  100 each    Refill:  12     Signed Abbe Amsterdam, MD  Made an appt for endocrinology tomorrow, called and let her know.  She is relieved to have close follow-up

## 2015-08-18 ENCOUNTER — Other Ambulatory Visit: Payer: BLUE CROSS/BLUE SHIELD

## 2015-08-18 ENCOUNTER — Ambulatory Visit (INDEPENDENT_AMBULATORY_CARE_PROVIDER_SITE_OTHER): Payer: BLUE CROSS/BLUE SHIELD | Admitting: Internal Medicine

## 2015-08-18 ENCOUNTER — Other Ambulatory Visit: Payer: Self-pay | Admitting: *Deleted

## 2015-08-18 ENCOUNTER — Encounter: Payer: Self-pay | Admitting: Internal Medicine

## 2015-08-18 VITALS — BP 112/60 | HR 91 | Temp 98.3°F | Resp 12 | Ht 64.0 in | Wt 115.6 lb

## 2015-08-18 DIAGNOSIS — IMO0001 Reserved for inherently not codable concepts without codable children: Secondary | ICD-10-CM

## 2015-08-18 DIAGNOSIS — R739 Hyperglycemia, unspecified: Secondary | ICD-10-CM

## 2015-08-18 DIAGNOSIS — Z794 Long term (current) use of insulin: Secondary | ICD-10-CM | POA: Diagnosis not present

## 2015-08-18 DIAGNOSIS — E119 Type 2 diabetes mellitus without complications: Secondary | ICD-10-CM | POA: Diagnosis not present

## 2015-08-18 MED ORDER — ACCU-CHEK FASTCLIX LANCETS MISC: Status: DC

## 2015-08-18 MED ORDER — INSULIN GLARGINE 300 UNIT/ML ~~LOC~~ SOPN: 15.0000 [IU] | PEN_INJECTOR | Freq: Every day | SUBCUTANEOUS | Status: DC

## 2015-08-18 MED ORDER — METFORMIN HCL 500 MG PO TABS: 500.0000 mg | ORAL_TABLET | Freq: Two times a day (BID) | ORAL | Status: DC

## 2015-08-18 NOTE — Telephone Encounter (Signed)
Pharmacy said they did not receive.  Resent refill for lancets.

## 2015-08-18 NOTE — Patient Instructions (Signed)
Please continue: - Metformin 500 mg 2x a day   Please increase: - Toujeo to 15 units at night for the next 5 days, then may need to increase to 18 units or even 20 units if sugars in am not <150.  Please send me your sugars in 1-2 weeks.  Please return in 1.5 months with your sugar log.   PATIENT INSTRUCTIONS FOR TYPE 2 DIABETES:  DIET AND EXERCISE Diet and exercise is an important part of diabetic treatment.  We recommended aerobic exercise in the form of brisk walking (working between 40-60% of maximal aerobic capacity, similar to brisk walking) for 150 minutes per week (such as 30 minutes five days per week) along with 3 times per week performing 'resistance' training (using various gauge rubber tubes with handles) 5-10 exercises involving the major muscle groups (upper body, lower body and core) performing 10-15 repetitions (or near fatigue) each exercise. Start at half the above goal but build slowly to reach the above goals. If limited by weight, joint pain, or disability, we recommend daily walking in a swimming pool with water up to waist to reduce pressure from joints while allow for adequate exercise.    BLOOD GLUCOSES Monitoring your blood glucoses is important for continued management of your diabetes. Please check your blood glucoses 2-4 times a day: fasting, before meals and at bedtime (you can rotate these measurements - e.g. one day check before the 3 meals, the next day check before 2 of the meals and before bedtime, etc.).   HYPOGLYCEMIA (low blood sugar) Hypoglycemia is usually a reaction to not eating, exercising, or taking too much insulin/ other diabetes drugs.  Symptoms include tremors, sweating, hunger, confusion, headache, etc. Treat IMMEDIATELY with 15 grams of Carbs: . 4 glucose tablets .  cup regular juice/soda . 2 tablespoons raisins . 4 teaspoons sugar . 1 tablespoon honey Recheck blood glucose in 15 mins and repeat above if still symptomatic/blood glucose  <100.  RECOMMENDATIONS TO REDUCE YOUR RISK OF DIABETIC COMPLICATIONS: * Take your prescribed MEDICATION(S) * Follow a DIABETIC diet: Complex carbs, fiber rich foods, (monounsaturated and polyunsaturated) fats * AVOID saturated/trans fats, high fat foods, >2,300 mg salt per day. * EXERCISE at least 5 times a week for 30 minutes or preferably daily.  * DO NOT SMOKE OR DRINK more than 1 drink a day. * Check your FEET every day. Do not wear tightfitting shoes. Contact us if you develop an ulcer * See your EYE doctor once a year or more if needed * Get a FLU shot once a year * Get a PNEUMONIA vaccine once before and once after age 30 years  GOALS:  * Your Hemoglobin A1c of <7%  * fasting sugars need to be <130 * after meals sugars need to be <180 (2h after you start eating) * Your Systolic BP should be 140 or lower  * Your Diastolic BP should be 80 or lower  * Your HDL (Good Cholesterol) should be 40 or higher  * Your LDL (Bad Cholesterol) should be 100 or lower. * Your Triglycerides should be 150 or lower  * Your Urine microalbumin (kidney function) should be <30 * Your Body Mass Index should be 25 or lower    Please consider the following ways to cut down carbs and fat and increase fiber and micronutrients in your diet: - substitute whole grain for white bread or pasta - substitute brown rice for white rice - substitute 90-calorie flat bread pieces for slices of bread  when possible - substitute sweet potatoes or yams for white potatoes - substitute humus for margarine - substitute tofu for cheese when possible - substitute almond or rice milk for regular milk (would not drink soy milk daily due to concern for soy estrogen influence on breast cancer risk) - substitute dark chocolate for other sweets when possible - substitute water - can add lemon or orange slices for taste - for diet sodas (artificial sweeteners will trick your body that you can eat sweets without getting calories and  will lead you to overeating and weight gain in the long run) - do not skip breakfast or other meals (this will slow down the metabolism and will result in more weight gain over time)  - can try smoothies made from fruit and almond/rice milk in am instead of regular breakfast - can also try old-fashioned (not instant) oatmeal made with almond/rice milk in am - order the dressing on the side when eating salad at a restaurant (pour less than half of the dressing on the salad) - eat as little meat as possible - can try juicing, but should not forget that juicing will get rid of the fiber, so would alternate with eating raw veg./fruits or drinking smoothies - use as little oil as possible, even when using olive oil - can dress a salad with a mix of balsamic vinegar and lemon juice, for e.g. - use agave nectar, stevia sugar, or regular sugar rather than artificial sweateners - steam or broil/roast veggies  - snack on veggies/fruit/nuts (unsalted, preferably) when possible, rather than processed foods - reduce or eliminate aspartame in diet (it is in diet sodas, chewing gum, etc) Read the labels!  Try to read Dr. Katherina Right book: "Program for Reversing Diabetes" for other ideas for healthy eating.

## 2015-08-18 NOTE — Progress Notes (Signed)
Patient ID: Deanna Nguyen, female   DOB: 01-25-1986, 30 y.o.   MRN: [id number]  HPI: Deanna Nguyen is a 30 y.o.-year-old female, referred by her PCP, Dr. Patsy Lageropland, for management of DM2, dx in 07/2015, previously GDM in 2016, on insulin.  She developed increased thirst and urination, but also blurry vision and HAs. She also mentions that she lost approximately 20 pounds in the last few months. She is telling me that she used to drink sodas and juice before, but stopped when she was diagnosed with diabetes. CBG checked at home: HI, then 572.  Last hemoglobin A1c was: Hemoglobin A1c was checked yesterday by PCP, results are still pending  Pt is on a regimen of: - Metformin 500 mg 2x a day, with meals - Toujeo 10 units at bedtime - started yesterday  Pt checks her sugars 1-5 a day and they are: - am: 250-398 - 2h after b'fast: 261-399 - before lunch: 316-448, 575 - 2h after lunch: 335-378 - before dinner: 355-480 - 2h after dinner: 181-485 - bedtime: n/c - nighttime: n/c No lows. Lowest sugar was 181; she has hypoglycemia awareness at 70.  Highest sugar was HI.  Glucometer: AccuChek (Fast Clix lancets)  Pt's meals are: - Breakfast: Eggs, green bananas and fish, cheese - Lunch: Chicken and vegetables - Dinner: Meat and vegetables - Snacks: No sodas or juices, only water now  - no CKD, last BUN/creatinine:  Lab Results  Component Value Date   BUN 11 08/14/2015   CREATININE 0.78 08/14/2015   - last set of lipids: No results found for: CHOL, HDL, LDLCALC, LDLDIRECT, TRIG, CHOLHDL  - no numbness and tingling in her feet.  Pt has FH of DM in PGM.  ROS: Constitutional: + weight loss, + increased appetite, + fatigue, no subjective hyperthermia/hypothermia+ Excessive urination Eyes: + blurry vision, no xerophthalmia ENT: no sore throat, no nodules palpated in throat, no dysphagia/odynophagia, no hoarseness Cardiovascular: no CP/+ SOB with exertion/+ palpitations/no leg  swelling Respiratory: no cough/+ SOB Gastrointestinal: no N/V/D/+ C Musculoskeletal: no muscle/joint aches Skin: no rashes Neurological: no tremors/numbness/tingling/dizziness Psychiatric: no depression/+ anxiety  Past Medical History  Diagnosis Date  . Gestational diabetes mellitus, antepartum     Gestational, with current pregnancy only  . Headache   . Gestational diabetes    Past Surgical History  Procedure Laterality Date  . No past surgeries     Social History   Social History  . Marital Status: Married    Spouse Name: Earna CoderZachary  . Number of Children: 2  . Years of Education: MA   Occupational History  .  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 Smoking status: Never Smoker   . Smokeless tobacco: Never Used  . Alcohol Use: No  . Drug Use: No   Current Outpatient Prescriptions on File Prior to Visit  Medication Sig Dispense Refill  . APRI 0.15-30 MG-MCG tablet Take 1 tablet by mouth daily.  8  . glucose blood test strip Use to test glucose as needed.  Pt uses the 1 touch ultra strips 100 each 12  . ibuprofen (ADVIL,MOTRIN) 600 MG tablet Take 1 tablet (600 mg total) by mouth every 6 (six) hours. 30 tablet 0  . Insulin Glargine (TOUJEO SOLOSTAR) 300 UNIT/ML SOPN Inject 10 Units into the skin daily. Increase as directed (Patient taking differently: Inject 10 Units into the skin every morning. Increase as directed) 4.5 mL 3  . metFORMIN (GLUCOPHAGE) 500 MG  tablet Take 1 tablet (500 mg total) by mouth 2 (two) times daily with a meal. (Patient not taking: Reported on 08/18/2015) 60 tablet 0   No current facility-administered medications on file prior to visit.   Allergies  Allergen Reactions  . Shellfish Allergy Itching and Swelling  . Shrimp [Shellfish Allergy] Shortness Of Breath    "Feels like my throat is closing up".   Family History  Problem Relation Age of Onset  . Hypertension Other   . Hypertension Mother   . Hypertension Father   .  Hypertension Maternal Grandmother   . Hypertension Maternal Grandfather   . Hypertension Paternal Grandmother   . Hypertension Paternal Grandfather    She also has a history of hyperlipidemia in mother. Also see history of present illness.  PE: BP 112/60 mmHg  Pulse 91  Temp(Src) 98.3 F (36.8 C) (Oral)  Resp 12  Ht  (1.626 m)  Wt 115 lb 9.6 oz (52.436 kg)  BMI 19.83 kg/m2  SpO2 98%  LMP 08/15/2015 Wt Readings from Last 3 Encounters:  08/18/15 115 lb 9.6 oz (52.436 kg)  08/17/15 115 lb 12.8 oz (52.527 kg)  08/14/15 114 lb 7 oz (51.909 kg)   Constitutional: Thin, in NAD Eyes: PERRLA, EOMI, no exophthalmos ENT: moist mucous membranes, no thyromegaly, no cervical lymphadenopathy Cardiovascular: RRR, No MRG Respiratory: CTA B Gastrointestinal: abdomen soft, NT, ND, BS+ Musculoskeletal: no deformities, strength intact in all 4 Skin: moist, warm, no rashes Neurological: no tremor with outstretched hands, DTR normal in all 4  ASSESSMENT: 1. DM, insulin-dependent, newly diagnosed PLAN:  1. Patient with history of gestational diabetes a year ago, now with new diagnosis of diabetes, unclear if type I or type II. She had several symptoms suggestive of uncontrolled diabetes, and check her sugars at home >> HI. She was started on metformin, and her sugars decreased in the 200s to 400s range. Yesterday, she added long-acting insulin, Lantus, 10 units. Since her sugars are so high, I advised her to increase this dose to 15 units and move it at night. I also instructed her how to increase the dose further, if needed. I would like to hold off adding mealtime insulin for now, but I advised her to let me know in 2 weeks about her sugars. We may need to start rapid acting insulin then. I advised her to continue with metformin for now - We discussed about short and term complications of diabetes and how to prevent them - Will need to check her for type 1 diabetes at next visit, since her  sugars need to be lower to be able to check for pancreatic insulin production. When we have a complete diagnosis, I plan to refer her to nutrition and diabetes education. - I suggested to:  Patient Instructions  Please continue: - Metformin 500 mg 2x a day   Please increase: - Toujeo to 15 units at night for the next 5 days, then may need to increase to 18 units or even 20 units if sugars in am not <150.  Please send me your sugars in 1-2 weeks.  Please return in 1.5 months with your sugar log.   - Continue checking sugars at different times of the day - check 2-3 times a day, rotating checks - given sugar log and advised how to fill it and to bring it at next appt  - given foot care handout and explained the principles  - given instructions for hypoglycemia management "15-15 rule"  - advised  for yearly eye exams  - Return to clinic in 1.5 mo with sugar log

## 2015-08-19 LAB — HEMOGLOBIN A1C
HEMOGLOBIN A1C: 13.1 % — AB (ref <5.7)
MEAN PLASMA GLUCOSE: 329 mg/dL

## 2015-08-24 ENCOUNTER — Other Ambulatory Visit: Payer: Self-pay | Admitting: Family Medicine

## 2015-08-24 ENCOUNTER — Encounter: Payer: Self-pay | Admitting: Family Medicine

## 2015-08-24 ENCOUNTER — Encounter: Payer: Self-pay | Admitting: Internal Medicine

## 2015-08-24 NOTE — Progress Notes (Signed)
Called and taken care of.

## 2015-08-25 ENCOUNTER — Encounter: Payer: Self-pay | Admitting: Family Medicine

## 2015-08-26 ENCOUNTER — Encounter: Payer: Self-pay | Admitting: Internal Medicine

## 2015-08-26 MED ORDER — BAYER MICROLET LANCETS MISC: Status: DC

## 2015-08-26 MED ORDER — BAYER CONTOUR NEXT MONITOR W/DEVICE KIT: PACK | Status: AC

## 2015-08-26 MED ORDER — GLUCOSE BLOOD VI STRP: ORAL_STRIP | Status: DC

## 2015-08-26 NOTE — Telephone Encounter (Signed)
Contour Next device and supplies sent to [facility] pharmacy.

## 2015-08-28 ENCOUNTER — Other Ambulatory Visit: Payer: Self-pay | Admitting: Internal Medicine

## 2015-08-28 DIAGNOSIS — E119 Type 2 diabetes mellitus without complications: Principal | ICD-10-CM

## 2015-08-28 DIAGNOSIS — Z794 Long term (current) use of insulin: Principal | ICD-10-CM

## 2015-08-28 DIAGNOSIS — IMO0001 Reserved for inherently not codable concepts without codable children: Secondary | ICD-10-CM

## 2015-08-28 MED ORDER — METFORMIN HCL 500 MG PO TABS: 500.0000 mg | ORAL_TABLET | Freq: Two times a day (BID) | ORAL | Status: DC

## 2015-08-31 ENCOUNTER — Telehealth: Payer: Self-pay | Admitting: Internal Medicine

## 2015-08-31 MED ORDER — METFORMIN HCL 500 MG PO TABS: 500.0000 mg | ORAL_TABLET | Freq: Two times a day (BID) | ORAL | Status: DC

## 2015-08-31 NOTE — Addendum Note (Signed)
Addended by: Adline MangoALLICUTT, Romilda Proby B on: 08/31/2015 11:55 AM   Modules accepted: Orders

## 2015-08-31 NOTE — Telephone Encounter (Signed)
[facility] needs new rx for her metformin with the new dosing thank you

## 2015-09-01 ENCOUNTER — Other Ambulatory Visit: Payer: Self-pay | Admitting: *Deleted

## 2015-09-01 ENCOUNTER — Other Ambulatory Visit: Payer: Self-pay | Admitting: Internal Medicine

## 2015-09-01 DIAGNOSIS — IMO0001 Reserved for inherently not codable concepts without codable children: Secondary | ICD-10-CM

## 2015-09-01 DIAGNOSIS — E119 Type 2 diabetes mellitus without complications: Principal | ICD-10-CM

## 2015-09-01 DIAGNOSIS — Z794 Long term (current) use of insulin: Principal | ICD-10-CM

## 2015-09-01 MED ORDER — METFORMIN HCL 1000 MG PO TABS: 1000.0000 mg | ORAL_TABLET | Freq: Two times a day (BID) | ORAL | Status: DC

## 2015-09-22 ENCOUNTER — Encounter: Payer: Self-pay | Admitting: Internal Medicine

## 2015-09-28 ENCOUNTER — Encounter: Payer: Self-pay | Admitting: Family Medicine

## 2015-09-29 ENCOUNTER — Encounter: Payer: Self-pay | Admitting: Internal Medicine

## 2015-09-29 ENCOUNTER — Ambulatory Visit (INDEPENDENT_AMBULATORY_CARE_PROVIDER_SITE_OTHER): Payer: BLUE CROSS/BLUE SHIELD | Admitting: Internal Medicine

## 2015-09-29 VITALS — BP 100/60 | HR 94 | Temp 99.0°F | Resp 12 | Wt 114.6 lb

## 2015-09-29 DIAGNOSIS — Z794 Long term (current) use of insulin: Secondary | ICD-10-CM | POA: Diagnosis not present

## 2015-09-29 DIAGNOSIS — E119 Type 2 diabetes mellitus without complications: Secondary | ICD-10-CM | POA: Diagnosis not present

## 2015-09-29 DIAGNOSIS — IMO0001 Reserved for inherently not codable concepts without codable children: Secondary | ICD-10-CM

## 2015-09-29 NOTE — Patient Instructions (Signed)
Please come back in 2 months with your sugar log.  Please continue: - Toujeo 12 units at bedtime - Metformin 1000 mg 2x a day

## 2015-09-29 NOTE — Progress Notes (Signed)
Patient ID: Deanna Nguyen, female   DOB: Apr 27, 1986, 29 y.o.   MRN: [id number]  HPI: Deanna Nguyen is a 30 y.o.-year-old female, returning for f/u for DM2, dx in 07/2015, previously GDM in 2016, on insulin. Last visit 1.5 mo ago. ObGyn: Dr. Benjie Karvonen.  Reviewed hx: She developed increased thirst and urination, but also blurry vision and HAs. She also mentions that she lost approximately 20 pounds in the last few months. She is telling me that she used to drink sodas and juice before, but stopped when she was diagnosed with diabetes. CBG checked at home: HI, then 572.  Last hemoglobin A1c was: Lab Results  Component Value Date   HGBA1C 13.1* 08/18/2015   Pt is on a regimen of: - Metformin 500 >> 1000 mg 2x a day, with meals - Toujeo 15 >> 12 units at bedtime   Pt checks her sugars 1-5 a day and they are dramatically improved since last visit - in last 2 weeks:  - am: 250-398 >> 68-93, 113 - 2h after b'fast: 261-399 >> n/c - before lunch: 316-448, 575 >> 68-103 - 2h after lunch: 335-378 >> n/c - before dinner: 355-480 >> 78-115 - 2h after dinner: 181-485 >> n/c - bedtime: n/c 105-152, 189 - nighttime: n/c No lows. Lowest sugar was 181 >> 56; she has hypoglycemia awareness at 70.  Highest sugar was HI >> 189.  Glucometer: AccuChek (Fast Clix lancets)  Pt's meals are: - Breakfast: Eggs, green bananas and fish, cheese - Lunch: Chicken and vegetables - Dinner: Meat and vegetables - Snacks: No sodas or juices, only water now  - no CKD, last BUN/creatinine:  Lab Results  Component Value Date   BUN 11 08/14/2015   CREATININE 0.78 08/14/2015   - last set of lipids: No results found for: CHOL, HDL, LDLCALC, LDLDIRECT, TRIG, CHOLHDL  - no numbness and tingling in her feet.  ROS: Constitutional: + weight loss, no fatigue, no subjective hyperthermia/hypothermia Eyes: no blurry vision, no xerophthalmia ENT: no sore throat, no nodules palpated in throat, no dysphagia/odynophagia, no  hoarseness Cardiovascular: no CP/SOB/palpitations/leg swelling Respiratory: no cough/SOB Gastrointestinal: no N/V/D/C Musculoskeletal: no muscle/joint aches Skin: no rashes Neurological: no tremors/numbness/tingling/dizziness  I reviewed pt's medications, allergies, PMH, social hx, family hx, and changes were documented in the history of present illness. Otherwise, unchanged from my initial visit note.  Past Medical History  Diagnosis Date  . Gestational diabetes mellitus, antepartum     Gestational, with current pregnancy only  . Headache   . Gestational diabetes    Past Surgical History  Procedure Laterality Date  . No past surgeries     Social History   Social History  . Marital Status: Married    Spouse Name: Alroy Dust  . Number of Children: 2  . Years of Education: MA   Occupational History  .  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 Smoking status: Never Smoker   . Smokeless tobacco: Never Used  . Alcohol Use: No  . Drug Use: No   Current Outpatient Prescriptions on File Prior to Visit  Medication Sig Dispense Refill  . BAYER MICROLET LANCETS lancets Check blood sugar three times daily 300 each 12  . Blood Glucose Monitoring Suppl (BAYER CONTOUR NEXT MONITOR) w/Device KIT Use to check blood sugar 1 kit 0  . glucose blood (BAYER CONTOUR NEXT TEST) test strip Check blood sugar three times daily. 300 each 12  . ibuprofen (ADVIL,MOTRIN) 600  MG tablet Take 1 tablet (600 mg total) by mouth every 6 (six) hours. (Patient taking differently: Take 600 mg by mouth as needed. ) 30 tablet 0  . Insulin Glargine (TOUJEO SOLOSTAR) 300 UNIT/ML SOPN Inject 15 Units into the skin daily. (Patient taking differently: Inject 12 Units into the skin daily. ) 15 mL 3  . metFORMIN (GLUCOPHAGE) 1000 MG tablet Take 1 tablet (1,000 mg total) by mouth 2 (two) times daily with a meal. 180 tablet 1   No current facility-administered medications on file prior to visit.    Allergies  Allergen Reactions  . Shellfish Allergy Itching and Swelling  . Shrimp [Shellfish Allergy] Shortness Of Breath    "Feels like my throat is closing up".   Family History  Problem Relation Age of Onset  . Hypertension Other   . Hypertension Mother   . Hypertension Father   . Hypertension Maternal Grandmother   . Hypertension Maternal Grandfather   . Hypertension Paternal Grandmother   . Hypertension Paternal Grandfather    She also has a history of hyperlipidemia in mother. Also see history of present illness.  PE: BP 100/60 mmHg  Pulse 94  Temp(Src) 99 F (37.2 C) (Oral)  Resp 12  Wt 114 lb 9.6 oz (51.982 kg)  SpO2 99% Body mass index is 19.66 kg/(m^2).  Wt Readings from Last 3 Encounters:  09/29/15 114 lb 9.6 oz (51.982 kg)  08/18/15 115 lb 9.6 oz (52.436 kg)  08/17/15 115 lb 12.8 oz (52.527 kg)   Constitutional: Thin, in NAD Eyes: PERRLA, EOMI, no exophthalmos ENT: moist mucous membranes, no thyromegaly, no cervical lymphadenopathy Cardiovascular: RRR, No MRG Respiratory: CTA B Gastrointestinal: abdomen soft, NT, ND, BS+ Musculoskeletal: no deformities, strength intact in all 4 Skin: moist, warm, no rashes Neurological: no tremor with outstretched hands, DTR normal in all 4  ASSESSMENT: 1. DM, insulin-dependent, recent diagnosis  PLAN:  1. Patient with history of gestational diabetes in 2016, now with recent diagnosis of diabetes, unclear if type I or type II. Sugars are greatly improved since last visit as she was started on insulin and she quit drinking juice >> we backed off insulin and increased Metformin since last visit as per her sugar logs sent through email. - Will need to check her for type 1 diabetes at next visit, along with a HbA1c and Lipid panel - I suggested to:  Patient Instructions  Please come back in 2 months with your sugar log.  Please continue: - Toujeo 12 units at bedtime - Metformin 1000 mg 2x a day  - Continue checking  sugars at different times of the day - check 1-2 times a day, rotating checks - advised for yearly eye exams  - Return to clinic in 2 mo with sugar log

## 2015-10-08 ENCOUNTER — Encounter: Payer: Self-pay | Admitting: Internal Medicine

## 2015-10-09 ENCOUNTER — Other Ambulatory Visit: Payer: Self-pay | Admitting: *Deleted

## 2015-10-09 MED ORDER — INSULIN PEN NEEDLE 32G X 4 MM MISC: Status: DC

## 2015-10-21 ENCOUNTER — Encounter: Payer: Self-pay | Admitting: Internal Medicine

## 2015-12-01 ENCOUNTER — Ambulatory Visit: Payer: Self-pay | Admitting: Internal Medicine

## 2016-05-13 ENCOUNTER — Encounter: Payer: Self-pay | Admitting: Internal Medicine

## 2016-05-18 ENCOUNTER — Encounter: Payer: Self-pay | Admitting: Internal Medicine

## 2016-07-07 ENCOUNTER — Ambulatory Visit (INDEPENDENT_AMBULATORY_CARE_PROVIDER_SITE_OTHER): Payer: BLUE CROSS/BLUE SHIELD | Admitting: Internal Medicine

## 2016-07-07 ENCOUNTER — Encounter: Payer: Self-pay | Admitting: Internal Medicine

## 2016-07-07 VITALS — BP 110/80 | HR 88 | Ht 62.0 in | Wt 124.0 lb

## 2016-07-07 DIAGNOSIS — E119 Type 2 diabetes mellitus without complications: Secondary | ICD-10-CM

## 2016-07-07 DIAGNOSIS — Z794 Long term (current) use of insulin: Secondary | ICD-10-CM | POA: Diagnosis not present

## 2016-07-07 DIAGNOSIS — IMO0001 Reserved for inherently not codable concepts without codable children: Secondary | ICD-10-CM

## 2016-07-07 LAB — LIPID PANEL
CHOLESTEROL: 131 mg/dL (ref 0–200)
HDL: 38.3 mg/dL — ABNORMAL LOW (ref >39.00)
LDL Cholesterol: 81 mg/dL (ref 0–99)
NonHDL: 92.89
Total CHOL/HDL Ratio: 3
Triglycerides: 59 mg/dL (ref 0.0–149.0)
VLDL: 11.8 mg/dL (ref 0.0–40.0)

## 2016-07-07 LAB — POCT GLYCOSYLATED HEMOGLOBIN (HGB A1C): HEMOGLOBIN A1C: 8.3

## 2016-07-07 LAB — MICROALBUMIN / CREATININE URINE RATIO
CREATININE, U: 79.2 mg/dL
MICROALB/CREAT RATIO: 0.9 mg/g (ref 0.0–30.0)

## 2016-07-07 MED ORDER — METFORMIN HCL ER 500 MG PO TB24: 1000.0000 mg | ORAL_TABLET | Freq: Two times a day (BID) | ORAL | 5 refills | Status: DC

## 2016-07-07 MED ORDER — INSULIN GLARGINE 300 UNIT/ML ~~LOC~~ SOPN: 18.0000 [IU] | PEN_INJECTOR | Freq: Every day | SUBCUTANEOUS | 5 refills | Status: DC

## 2016-07-07 NOTE — Patient Instructions (Addendum)
Please stop regular Metformin and start metformin ER 1000 mg 2x a day.  Continue Toujeo 18 units at bedtime.  If sugars are not better or you cannot tolerate Metformin, let me know so I can send Januvia to your pharmacy.  Please return in 3 months with your sugar log.   Please stop at the lab.

## 2016-07-07 NOTE — Progress Notes (Signed)
Patient ID: Deanna Nguyen, female   DOB: February 02, 1986, 31 y.o.   MRN: [id number]  HPI: Deanna Nguyen is a 31 y.o.-year-old female, returning for f/u for DM2, dx in 07/2015, previously GDM in 2016, on insulin. Last visit 8  mo ago! ObGyn: Dr. Benjie Karvonen.  Reviewed hx: She developed increased thirst and urination, but also blurry vision and HAs . She also mentions that she lost approximately 20 pounds in  few months. She is telling me that she used to drink sodas and juice before, but stopped when she was diagnosed with diabetes. CBG checked at home: HI, then 572.  Last hemoglobin A1c was: Lab Results  Component Value Date   HGBA1C 13.1 (H) 08/18/2015   Pt is on a regimen of: - Metformin 500 >> 1000 mg 2x a day, with meals >> stopped for 1-2 months >> now off and on b/c nausea, vomiting, irritability - Toujeo 15 >> 12 >> 18 units at bedtime   Pt checks her sugars 1-5 a day and they are higher: - am: 250-398 >> 68-93, 113 >> 120-160 - 2h after b'fast: 261-399 >> n/c - before lunch: 316-448, 575 >> 68-103 >> 155-180 - 2h after lunch: 335-378 >> n/c - before dinner: 355-480 >> 78-115 >> n/c - 2h after dinner: 181-485 >> n/c - bedtime: n/c 105-152, 189 >> >200 - nighttime: n/c No lows. Lowest sugar was 181 >> 56; she has hypoglycemia awareness at 70.  Highest sugar was HI >> 189.  Glucometer: AccuChek (Fast Clix lancets)  Pt's meals are: - Breakfast: Eggs, green bananas and fish, cheese - Lunch: Chicken and vegetables - Dinner: Meat and vegetables - Snacks: No sodas or juices, only water now  - no CKD, last BUN/creatinine:  Lab Results  Component Value Date   BUN 11 08/14/2015   CREATININE 0.78 08/14/2015   - last set of lipids: No results found for: CHOL, HDL, LDLCALC, LDLDIRECT, TRIG, CHOLHDL  - no numbness and tingling in her feet.  ROS: Constitutional: + weight gain, + fatigue, no subjective hyperthermia/hypothermia Eyes: + blurry vision, no xerophthalmia ENT: no sore  throat, no nodules palpated in throat, no dysphagia/odynophagia, no hoarseness, + occasional tinnitus Cardiovascular: no CP/SOB/palpitations/leg swelling Respiratory: no cough/SOB Gastrointestinal: no N/V/D/C Musculoskeletal: no muscle/joint aches Skin: no rashes Neurological: no tremors/numbness/tingling/dizziness, + HA + irreg menses, low libido  I reviewed pt's medications, allergies, PMH, social hx, family hx, and changes were documented in the history of present illness. Otherwise, unchanged from my initial visit note.  Past Medical History:  Diagnosis Date  . Gestational diabetes   . Gestational diabetes mellitus, antepartum    Gestational, with current pregnancy only  . Headache    Past Surgical History:  Procedure Laterality Date  . NO PAST SURGERIES     Social History   Social History  . Marital Status: Married    Spouse Name: Alroy Dust  . Number of Children: 2  . Years of Education: MA   Occupational History  .  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 Smoking status: Never Smoker   . Smokeless tobacco: Never Used  . Alcohol Use: No  . Drug Use: No   Current Outpatient Prescriptions on File Prior to Visit  Medication Sig Dispense Refill  . BAYER MICROLET LANCETS lancets Check blood sugar three times daily 300 each 12  . Blood Glucose Monitoring Suppl (BAYER CONTOUR NEXT MONITOR) w/Device KIT Use to check blood sugar 1 kit  0  . glucose blood (BAYER CONTOUR NEXT TEST) test strip Check blood sugar three times daily. 300 each 12  . ibuprofen (ADVIL,MOTRIN) 600 MG tablet Take 1 tablet (600 mg total) by mouth every 6 (six) hours. (Patient taking differently: Take 600 mg by mouth as needed. ) 30 tablet 0  . Insulin Glargine (TOUJEO SOLOSTAR) 300 UNIT/ML SOPN Inject 15 Units into the skin daily. (Patient taking differently: Inject 18 Units into the skin daily. ) 15 mL 3  . Insulin Pen Needle 32G X 4 MM MISC Use to inject insulin once daily. 90 each 3   . levonorgestrel (MIRENA) 20 MCG/24HR IUD 1 each by Intrauterine route once.    . metFORMIN (GLUCOPHAGE) 1000 MG tablet Take 1 tablet (1,000 mg total) by mouth 2 (two) times daily with a meal. 180 tablet 1   No current facility-administered medications on file prior to visit.    Allergies  Allergen Reactions  . Shellfish Allergy Itching and Swelling  . Shrimp [Shellfish Allergy] Shortness Of Breath    "Feels like my throat is closing up".   Family History  Problem Relation Age of Onset  . Hypertension Other   . Hypertension Mother   . Hypertension Father   . Hypertension Maternal Grandmother   . Hypertension Maternal Grandfather   . Hypertension Paternal Grandmother   . Hypertension Paternal Grandfather    She also has a history of hyperlipidemia in mother. Also see history of present illness.  PE: BP 110/80 (BP Location: Left Arm, Patient Position: Sitting)   Pulse 88   Ht 5' 2"  (1.575 m)   Wt 124 lb (56.2 kg)   LMP 06/06/2016   SpO2 98%   BMI 22.68 kg/m  Body mass index is 22.68 kg/m.  Wt Readings from Last 3 Encounters:  07/07/16 124 lb (56.2 kg)  09/29/15 114 lb 9.6 oz (52 kg)  08/18/15 115 lb 9.6 oz (52.4 kg)   Constitutional: Thin, in NAD Eyes: PERRLA, EOMI, no exophthalmos ENT: moist mucous membranes, no thyromegaly, no cervical lymphadenopathy Cardiovascular: RRR, No MRG Respiratory: CTA B Gastrointestinal: abdomen soft, NT, ND, BS+ Musculoskeletal: no deformities, strength intact in all 4 Skin: moist, warm, no rashes Neurological: no tremor with outstretched hands, DTR normal in all 4  ASSESSMENT: 1. DM, insulin-dependent, recent diagnosis  PLAN:  1. Patient with history of gestational diabetes in 2016, then with diagnosis of diabetes, unclear if type I or type II, now returning after 8 mo absence. Sugars were much better at last visit as she was started on insulin and she quit drinking juice. Now higher after stopping Metformin 2/2 N/V/irritability.  Will try to change to Metformin ER but if she cannot tolerate this >> will start Januvia. - HbA1c 8.3% today (better, but still above goal) - Will need to check her for type 1 diabetes today (office CBG 189), along with a CMP, ACR, Lipid panel - I suggested to:  Patient Instructions  Please stop regular Metformin and start metformin ER 1000 mg 2x a day.  Continue Toujeo 18 units at bedtime.  If sugars are not better or you cannot tolerate Metformin, let me know so I can send Januvia to your pharmacy.  Please return in 3 months with your sugar log.   - Continue checking sugars at different times of the day - check 1-2 times a day, rotating checks - advised for yearly eye exams  - Return to clinic in 3 mo with sugar log   Office Visit on 07/07/2016  Component Date Value Ref Range Status  . Sodium 07/07/2016 139  135 - 146 mmol/L Final  . Potassium 07/07/2016 4.4  3.5 - 5.3 mmol/L Final  . Chloride 07/07/2016 104  98 - 110 mmol/L Final  . CO2 07/07/2016 29  20 - 31 mmol/L Final  . Glucose, Bld 07/07/2016 175* 65 - 99 mg/dL Final  . BUN 07/07/2016 12  7 - 25 mg/dL Final  . Creat 07/07/2016 0.83  0.50 - 1.10 mg/dL Final  . Total Bilirubin 07/07/2016 0.4  0.2 - 1.2 mg/dL Final  . Alkaline Phosphatase 07/07/2016 55  33 - 115 U/L Final  . AST 07/07/2016 12  10 - 30 U/L Final  . ALT 07/07/2016 9  6 - 29 U/L Final  . Total Protein 07/07/2016 7.7  6.1 - 8.1 g/dL Final  . Albumin 07/07/2016 4.4  3.6 - 5.1 g/dL Final  . Calcium 07/07/2016 9.3  8.6 - 10.2 mg/dL Final  . GFR, Est African American 07/07/2016 >89  >=60 mL/min Final  . GFR, Est Non African American 07/07/2016 >89  >=60 mL/min Final  . Microalb, Ur 07/07/2016 <0.7  0.0 - 1.9 mg/dL Final  . Creatinine,U 07/07/2016 79.2  mg/dL Final  . Microalb Creat Ratio 07/07/2016 0.9  0.0 - 30.0 mg/g Final  . Cholesterol 07/07/2016 131  0 - 200 mg/dL Final  . Triglycerides 07/07/2016 59.0  0.0 - 149.0 mg/dL Final  . HDL 07/07/2016 38.30*  >39.00 mg/dL Final  . VLDL 07/07/2016 11.8  0.0 - 40.0 mg/dL Final  . LDL Cholesterol 07/07/2016 81  0 - 99 mg/dL Final  . Total CHOL/HDL Ratio 07/07/2016 3   Final  . NonHDL 07/07/2016 92.89   Final   Component     Latest Ref Rng & Units 07/07/2016  C-Peptide     0.80 - 3.85 ng/mL 3.17  Glucose, Fasting     65 - 99 mg/dL 177 (H)  Glutamic Acid Decarb Ab     <5 IU/mL >250 (H)  Pancreatic Islet Cell Antibody     <5 JDF Units <5  High GAD antibodies, pointing towards autoimmune diabetes. As of now, she has good insulin production based on the C-peptide, so I would suggest to continue with the oral medicines along with her insulin.  Philemon Kingdom, MD PhD [facility] Endocr[facility]

## 2016-07-08 ENCOUNTER — Encounter: Payer: Self-pay | Admitting: Internal Medicine

## 2016-07-08 LAB — C-PEPTIDE: C PEPTIDE: 3.17 ng/mL (ref 0.80–3.85)

## 2016-07-08 LAB — COMPLETE METABOLIC PANEL WITH GFR
ALT: 9 U/L (ref 6–29)
AST: 12 U/L (ref 10–30)
Albumin: 4.4 g/dL (ref 3.6–5.1)
Alkaline Phosphatase: 55 U/L (ref 33–115)
BUN: 12 mg/dL (ref 7–25)
CHLORIDE: 104 mmol/L (ref 98–110)
CO2: 29 mmol/L (ref 20–31)
Calcium: 9.3 mg/dL (ref 8.6–10.2)
Creat: 0.83 mg/dL (ref 0.50–1.10)
GFR, Est African American: >89 mL/min (ref >=60)
GFR, Est Non African American: >89 mL/min (ref >=60)
GLUCOSE: 175 mg/dL — AB (ref 65–99)
POTASSIUM: 4.4 mmol/L (ref 3.5–5.3)
SODIUM: 139 mmol/L (ref 135–146)
Total Bilirubin: 0.4 mg/dL (ref 0.2–1.2)
Total Protein: 7.7 g/dL (ref 6.1–8.1)

## 2016-07-08 LAB — GLUCOSE, FASTING: Glucose, Fasting: 177 mg/dL — ABNORMAL HIGH (ref 65–99)

## 2016-07-10 LAB — GLUTAMIC ACID DECARBOXYLASE AUTO ABS: Glutamic Acid Decarb Ab: >250 IU/mL — ABNORMAL HIGH (ref <5)

## 2016-07-16 LAB — ANTI-ISLET CELL ANTIBODY

## 2016-07-18 ENCOUNTER — Encounter: Payer: Self-pay | Admitting: Internal Medicine

## 2016-09-08 ENCOUNTER — Other Ambulatory Visit: Payer: Self-pay | Admitting: Internal Medicine

## 2016-09-08 DIAGNOSIS — IMO0001 Reserved for inherently not codable concepts without codable children: Secondary | ICD-10-CM

## 2016-09-08 DIAGNOSIS — Z794 Long term (current) use of insulin: Principal | ICD-10-CM

## 2016-09-08 DIAGNOSIS — E119 Type 2 diabetes mellitus without complications: Principal | ICD-10-CM

## 2016-09-29 ENCOUNTER — Encounter: Payer: Self-pay | Admitting: Internal Medicine

## 2016-10-04 ENCOUNTER — Ambulatory Visit: Payer: Self-pay | Admitting: Internal Medicine

## 2016-10-25 ENCOUNTER — Encounter: Payer: Self-pay | Admitting: Family Medicine

## 2016-11-02 ENCOUNTER — Ambulatory Visit (HOSPITAL_BASED_OUTPATIENT_CLINIC_OR_DEPARTMENT_OTHER)
Admission: RE | Admit: 2016-11-02 | Discharge: 2016-11-02 | Disposition: A | Discharge status: Home / Self Care | Payer: BLUE CROSS/BLUE SHIELD | Source: Ambulatory Visit | Attending: Family Medicine | Admitting: Family Medicine

## 2016-11-02 ENCOUNTER — Ambulatory Visit (INDEPENDENT_AMBULATORY_CARE_PROVIDER_SITE_OTHER): Payer: BLUE CROSS/BLUE SHIELD | Admitting: Family Medicine

## 2016-11-02 ENCOUNTER — Encounter: Payer: Self-pay | Admitting: Family Medicine

## 2016-11-02 VITALS — BP 116/80 | HR 87 | Temp 98.4°F | Ht 62.0 in | Wt 123.6 lb

## 2016-11-02 DIAGNOSIS — E119 Type 2 diabetes mellitus without complications: Secondary | ICD-10-CM

## 2016-11-02 DIAGNOSIS — Z794 Long term (current) use of insulin: Secondary | ICD-10-CM | POA: Diagnosis not present

## 2016-11-02 DIAGNOSIS — R079 Chest pain, unspecified: Secondary | ICD-10-CM

## 2016-11-02 DIAGNOSIS — R0789 Other chest pain: Secondary | ICD-10-CM | POA: Insufficient documentation

## 2016-11-02 DIAGNOSIS — IMO0001 Reserved for inherently not codable concepts without codable children: Secondary | ICD-10-CM

## 2016-11-02 LAB — TROPONIN I: Troponin I: <0.01 ng/mL (ref <=0.05)

## 2016-11-02 NOTE — Progress Notes (Addendum)
[place] at [facility] [address], [place], [place] [address] [phone]  Date:  11/02/2016   Name:  Deanna Nguyen   DOB:  03-30-1986   MRN:  [id number]  PCP:  Darreld Mclean, MD    Chief Complaint: Chest Pain (with SOB)   History of Present Illness:  Deanna Nguyen is a 31 y.o. very pleasant female patient who presents with the following:  Seen by myself in March 2017, when she was noted to have diabetes (history of GDM, then had developed sx of diabetes again).  She is now seen by endocrinology,Dr. Cruzita Lederer- last seen by her in February.  Here today with concern of chest pain which she has noticed for the last week or so.   The pain will come and go.  She has a hard time describing how long the pain lasts.  It may come once a day or several times a day.   She has not been getting much formal exercise lately. However she does notice tht the pain is NOT exertional. Can occur when she is sitting quietly or even resting in bed She sometimes feels like she needs to take a deep breath to open her lungs but does not have any SOB otherwise  No cough or fever No rash Her mother has HTN. Her father does as well.  Her grandmother had "a heart stroke" at the age of approx 34 and developed weakness of one side of her body (? Likely a CVA)- otherwise no family history of cardiac diease as far as she knows  She is a never smoker  Lab Results  Component Value Date   HGBA1C 8.3 07/07/2016   Her A1c in 2017 was 13.1- much improved with treatment as above  She has a mirena IUD- this was placed last year No on any estrogens Never had a DVT or PE, not a smoker, [place]- flight about 3 hours in duration No recent operations  No calf pain or swelling.   She needs an A1c, and would like to update her basic labs as well which we can do for her today  Patient Active Problem List   Diagnosis Date Noted  . IDDM  (insulin dependent diabetes mellitus) ([place]) 08/17/2015  . Postpartum care following vacuum-assisted vaginal delivery (3/1) 07/22/2014  . Cephalic version 36/64/4034    Past Medical History:  Diagnosis Date  . Gestational diabetes   . Gestational diabetes mellitus, antepartum    Gestational, with current pregnancy only  . Headache     Past Surgical History:  Procedure Laterality Date  . NO PAST SURGERIES      Social History  Substance Use Topics  . Smoking status: Never Smoker  . Smokeless tobacco: Never Used  . Alcohol use No    Family History  Problem Relation Age of Onset  . Hypertension Other   . Hypertension Mother   . Hypertension Father   . Hypertension Maternal Grandmother   . Hypertension Maternal Grandfather   . Hypertension Paternal Grandmother   . Hypertension Paternal Grandfather     Allergies  Allergen Reactions  . Shellfish Allergy Itching and Swelling  . Shrimp [Shellfish Allergy] Shortness Of Breath    "Feels like my throat is closing up".    Medication list has been reviewed and updated.  Current Outpatient Prescriptions on File Prior to Visit  Medication Sig Dispense Refill  . BAYER MICROLET LANCETS  lancets Check blood sugar three times daily 300 each 12  . Blood Glucose Monitoring Suppl (BAYER CONTOUR NEXT MONITOR) w/Device KIT Use to check blood sugar 1 kit 0  . glucose blood (BAYER CONTOUR NEXT TEST) test strip Check blood sugar three times daily. 300 each 12  . ibuprofen (ADVIL,MOTRIN) 600 MG tablet Take 1 tablet (600 mg total) by mouth every 6 (six) hours. (Patient taking differently: Take 600 mg by mouth as needed. ) 30 tablet 0  . Insulin Glargine (TOUJEO SOLOSTAR) 300 UNIT/ML SOPN Inject 18 Units into the skin daily. 3 pen 5  . Insulin Pen Needle 32G X 4 MM MISC Use to inject insulin once daily. 90 each 3  . levonorgestrel (MIRENA) 20 MCG/24HR IUD 1 each by Intrauterine route once.    . metFORMIN (GLUCOPHAGE-XR) 500 MG 24 hr tablet Take  2 tablets (1,000 mg total) by mouth 2 (two) times daily with a meal. 120 tablet 5  . TOUJEO SOLOSTAR 300 UNIT/ML SOPN INJECT 15 UNITS INTO THE SKIN DAILY. 15 pen 1   No current facility-administered medications on file prior to visit.     Review of Systems:  As per HPI- otherwise negative.   Physical Examination: Vitals:   11/02/16 1543  BP: 116/80  Pulse: 87  Temp: 98.4 F (36.9 C)   Vitals:   11/02/16 1543  Weight: 123 lb 9.6 oz (56.1 kg)  Height: 5' 2"  (1.575 m)   Body mass index is 22.61 kg/m. Ideal Body Weight: Weight in (lb) to have BMI = 25: 136.4  GEN: WDWN, NAD, Non-toxic, A & O x 3, normal weight, looks well HEENT: Atraumatic, Normocephalic. Neck supple. No masses, No LAD. Ears and Nose: No external deformity. CV: RRR, No M/G/R. No JVD. No thrill. No extra heart sounds. PULM: CTA B, no wheezes, crackles, rhonchi. No retractions. No resp. distress. No accessory muscle use. ABD: S, NT, ND, +BS. No rebound. No HSM. I am able to somewhat reproduce her CP by pressing on her chest wall EXTR: No c/c/e NEURO Normal gait.  PSYCH: Normally interactive. Conversant. Not depressed or anxious appearing.  Calm demeanor.  No calf swelling or tenderness   EKG: NSR, rate of 71, benign tracing No old EKG for comparison  Dg Chest 2 View  Result Date: 11/02/2016 CLINICAL DATA:  Chest pain and shortness of Breath EXAM: CHEST  2 VIEW COMPARISON:  None. FINDINGS: The heart size and mediastinal contours are within normal limits. Both lungs are clear. The visualized skeletal structures are unremarkable. IMPRESSION: No active cardiopulmonary disease. Electronically Signed   By: Inez Catalina M.D.   On: 11/02/2016 16:31   Assessment and Plan: IDDM (insulin dependent diabetes mellitus) ([place]) - Plan: Hemoglobin A1c, Comprehensive metabolic panel, Troponin I  Chest pain of uncertain etiology - Plan: EKG 12-Lead, DG Chest 2 View, CBC, Troponin I, CANCELED: Troponin I  Here today with  chest pain, atypical for ischemia.  Her sx are also intermittent, she does not have PE risk factors so doubt PT.  Discussed doing a D dimer with pt- she declines due to risk of false positive which may lead to a CT angiogram that she does not need We did decide to do a troponin for further assurance that her sx are not cardiac in origin   Also will update routine labs for her as above Suspect that her pain is either MSK or GI in origin.  Will have her try an OTC H2 blocker  She prefers to see how  she does over the next few days prior to starting any further referrals or testing However she is cautioned to seek care right away if her sx worsen or become more persistent.  She states understanding and agreement   Results for orders placed or performed in visit on 11/02/16  Troponin I  Result Value Ref Range   Troponin I <0.01 <=0.05 ng/mL   Signed Lamar Blinks, MD  Received the rest of her labs 6/15  Results for orders placed or performed in visit on 11/02/16  Hemoglobin A1c  Result Value Ref Range   Hgb A1c MFr Bld 7.0 (H) 4.6 - 6.5 %  CBC  Result Value Ref Range   WBC 10.0 4.0 - 10.5 K/uL   RBC 4.87 3.87 - 5.11 Mil/uL   Platelets 235.0 150.0 - 400.0 K/uL   Hemoglobin 13.2 12.0 - 15.0 g/dL   HCT 40.1 36.0 - 46.0 %   MCV 82.3 78.0 - 100.0 fl   MCHC 32.9 30.0 - 36.0 g/dL   RDW 13.0 11.5 - 15.5 %  Comprehensive metabolic panel  Result Value Ref Range   Sodium 138 135 - 145 mEq/L   Potassium 4.0 3.5 - 5.1 mEq/L   Chloride 102 96 - 112 mEq/L   CO2 30 19 - 32 mEq/L   Glucose, Bld 85 70 - 99 mg/dL   BUN 13 6 - 23 mg/dL   Creatinine, Ser 0.89 0.40 - 1.20 mg/dL   Total Bilirubin 0.4 0.2 - 1.2 mg/dL   Alkaline Phosphatase 53 39 - 117 U/L   AST 13 0 - 37 U/L   ALT 10 0 - 35 U/L   Total Protein 7.7 6.0 - 8.3 g/dL   Albumin 4.4 3.5 - 5.2 g/dL   Calcium 9.4 8.4 - 10.5 mg/dL   GFR 78.68 >60.00 mL/min  Troponin I  Result Value Ref Range   Troponin I <0.01 <=0.05 ng/mL

## 2016-11-02 NOTE — Patient Instructions (Signed)
It was nice to see you today- take care and I will be in touch with your labs asap If you have any worsening of your pain, or if you develop any persistent shortness of breath please seek care right away Please pick up a box of zantac (generic ranitidine is ok) OTC and use according to the package directions

## 2016-11-03 LAB — COMPREHENSIVE METABOLIC PANEL
ALT: 10 U/L (ref 0–35)
AST: 13 U/L (ref 0–37)
Albumin: 4.4 g/dL (ref 3.5–5.2)
Alkaline Phosphatase: 53 U/L (ref 39–117)
BUN: 13 mg/dL (ref 6–23)
CALCIUM: 9.4 mg/dL (ref 8.4–10.5)
CHLORIDE: 102 meq/L (ref 96–112)
CO2: 30 meq/L (ref 19–32)
CREATININE: 0.89 mg/dL (ref 0.40–1.20)
GFR: 78.68 mL/min (ref >60.00)
GLUCOSE: 85 mg/dL (ref 70–99)
Potassium: 4 mEq/L (ref 3.5–5.1)
Sodium: 138 mEq/L (ref 135–145)
Total Bilirubin: 0.4 mg/dL (ref 0.2–1.2)
Total Protein: 7.7 g/dL (ref 6.0–8.3)

## 2016-11-03 LAB — CBC
HCT: 40.1 % (ref 36.0–46.0)
Hemoglobin: 13.2 g/dL (ref 12.0–15.0)
MCHC: 32.9 g/dL (ref 30.0–36.0)
MCV: 82.3 fl (ref 78.0–100.0)
PLATELETS: 235 10*3/uL (ref 150.0–400.0)
RBC: 4.87 Mil/uL (ref 3.87–5.11)
RDW: 13 % (ref 11.5–15.5)
WBC: 10 10*3/uL (ref 4.0–10.5)

## 2016-11-03 LAB — HEMOGLOBIN A1C: HEMOGLOBIN A1C: 7 % — AB (ref 4.6–6.5)

## 2016-11-04 ENCOUNTER — Encounter: Payer: Self-pay | Admitting: Family Medicine

## 2016-12-12 ENCOUNTER — Encounter: Payer: Self-pay | Admitting: Internal Medicine

## 2016-12-13 ENCOUNTER — Other Ambulatory Visit: Payer: Self-pay

## 2016-12-13 ENCOUNTER — Other Ambulatory Visit: Payer: Self-pay | Admitting: Internal Medicine

## 2016-12-13 DIAGNOSIS — IMO0001 Reserved for inherently not codable concepts without codable children: Secondary | ICD-10-CM

## 2016-12-13 DIAGNOSIS — E119 Type 2 diabetes mellitus without complications: Principal | ICD-10-CM

## 2016-12-13 DIAGNOSIS — Z794 Long term (current) use of insulin: Principal | ICD-10-CM

## 2016-12-13 MED ORDER — GLUCOSE BLOOD VI STRP: ORAL_STRIP | 12 refills | Status: DC

## 2016-12-13 MED ORDER — INSULIN PEN NEEDLE 32G X 4 MM MISC: 3 refills | Status: DC

## 2016-12-13 MED ORDER — BAYER MICROLET LANCETS MISC: 12 refills | Status: DC

## 2016-12-13 MED ORDER — METFORMIN HCL ER 500 MG PO TB24: 1000.0000 mg | ORAL_TABLET | Freq: Two times a day (BID) | ORAL | 5 refills | Status: DC

## 2016-12-13 MED ORDER — INSULIN GLARGINE 300 UNIT/ML ~~LOC~~ SOPN: 18.0000 [IU] | PEN_INJECTOR | Freq: Every day | SUBCUTANEOUS | 5 refills | Status: DC

## 2017-07-17 ENCOUNTER — Encounter: Payer: Self-pay | Admitting: Family Medicine

## 2017-08-15 ENCOUNTER — Ambulatory Visit: Payer: BLUE CROSS/BLUE SHIELD | Admitting: Internal Medicine

## 2017-08-15 ENCOUNTER — Encounter: Payer: Self-pay | Admitting: Internal Medicine

## 2017-08-15 VITALS — BP 104/64 | HR 82 | Ht 62.0 in | Wt 118.8 lb

## 2017-08-15 DIAGNOSIS — E119 Type 2 diabetes mellitus without complications: Secondary | ICD-10-CM | POA: Diagnosis not present

## 2017-08-15 DIAGNOSIS — IMO0001 Reserved for inherently not codable concepts without codable children: Secondary | ICD-10-CM

## 2017-08-15 DIAGNOSIS — E785 Hyperlipidemia, unspecified: Secondary | ICD-10-CM | POA: Diagnosis not present

## 2017-08-15 DIAGNOSIS — Z794 Long term (current) use of insulin: Secondary | ICD-10-CM

## 2017-08-15 LAB — POCT GLYCOSYLATED HEMOGLOBIN (HGB A1C): HEMOGLOBIN A1C: 11.2

## 2017-08-15 MED ORDER — INSULIN ASPART 100 UNIT/ML FLEXPEN: 4.0000 [IU] | PEN_INJECTOR | Freq: Three times a day (TID) | SUBCUTANEOUS | 5 refills | Status: DC

## 2017-08-15 MED ORDER — INSULIN PEN NEEDLE 32G X 4 MM MISC: 5 refills | Status: DC

## 2017-08-15 MED ORDER — INSULIN GLARGINE 300 UNIT/ML ~~LOC~~ SOPN: 20.0000 [IU] | PEN_INJECTOR | Freq: Every day | SUBCUTANEOUS | 5 refills | Status: DC

## 2017-08-15 NOTE — Progress Notes (Signed)
Patient ID: Deanna Nguyen, female   DOB: [birth year], 32 y.o.   MRN: [id number]  HPI: Deanna Nguyen is a 32 y.o.-year-old femaler-old female, returning for f/u for DM2, dx in 07/2015, previously GDM in 2016, on insulin. Last visit 13 mo ago! ObGyn: Dr. Benjie Karvonen.  Reviewed history: She developed increased thirst and urination, but also blurry vision and HAs . She also mentions that she lost approximately 20 pounds in  few months. She is telling me that she used to drink sodas and juice before, but stopped when she was diagnosed with diabetes. CBG checked at home: HI, then 572.  Last hemoglobin A1c was: Lab Results  Component Value Date   HGBA1C 7.0 (H) 11/02/2016   HGBA1C 8.3 07/07/2016   HGBA1C 13.1 (H) 08/18/2015   Pt is on a regimen of: - Toujeo 15 >> 12 >> 18 >> 20 units at bedtime  She was on Metformin 500 >> metformin ER 1000 mg 2x a day, with meals >> stopped "long time ago" 2/2 nausea.  Pt checks her sugars 1-3x a day and they are much higher: - am: 250-398 >> 68-93, 113 >> 120-160 >> 300s - 2h after b'fast: 261-399 >> n/c - before lunch: 316-448, 575 >> 68-103 >> 155-180 >> n/c - 2h after lunch: 335-378 >> n/c - before dinner: 355-480 >> 78-115 >> n/c >> 150-200s - 2h after dinner: 181-485 >> n/c - bedtime: n/c 105-152, 189 >> >200 >> 300s - nighttime: n/c Lowest sugar was 181 >> 56 >> 150; she has hypoglycemia awareness in the 70s. Highest sugar was HI >> 189 >> 300s.  Glucometer: AccuChek (Fast Clix lancets)  Pt's meals are: - Breakfast: Eggs, green bananas and fish, cheese - Lunch: Chicken and vegetables - Dinner: Meat and vegetables - Snacks: No sodas or juices, only water now  - no CKD, last BUN/creatinine:  Lab Results  Component Value Date   BUN 13 11/02/2016   CREATININE 0.89 11/02/2016   - + Dyslipidemia; last set of lipids: Lab Results  Component Value Date   CHOL 131 07/07/2016   HDL 38.30 (L) 07/07/2016   LDLCALC 81 07/07/2016   TRIG 59.0 07/07/2016   CHOLHDL 3  07/07/2016   - no numbness and tingling in her feet.  ROS: Constitutional: no weight gain/no weight loss, + fatigue, no subjective hyperthermia, no subjective hypothermia Eyes: + Occasional blurry vision, no xerophthalmia ENT: no sore throat, no nodules palpated in throat, no dysphagia, no odynophagia, no hoarseness Cardiovascular: no CP/+ exertional SOB/no palpitations/no leg swelling Respiratory: no cough/no wheezing Gastrointestinal: no N/no V/no D/no C/no acid reflux Musculoskeletal: no muscle aches/+ joint aches Skin: no rashes, no hair loss Neurological: no tremors/no numbness/no tingling/no dizziness, + headaches  I reviewed pt's medications, allergies, PMH, social hx, family hx, and changes were documented in the history of present illness. Otherwise, unchanged from my initial visit note.  Past Medical History:  Diagnosis Date  . Gestational diabetes   . Gestational diabetes mellitus, antepartum    Gestational, with current pregnancy only  . Headache    Past Surgical History:  Procedure Laterality Date  . NO PAST SURGERIES     Social History   Social History  . Marital Status: Married    Spouse Name: Alroy Dust  . Number of Children: 2  . Years of Education: MA   Occupational History  .  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 Smoking status: Never Smoker   .  Smokeless tobacco: Never Used  . Alcohol Use: No  . Drug Use: No   Current Outpatient Medications on File Prior to Visit  Medication Sig Dispense Refill  . BAYER MICROLET LANCETS lancets Check blood sugar three times daily 300 each 12  . BD PEN NEEDLE NANO U/F 32G X 4 MM MISC USE TO INJECT INSULIN EVERY DAY 100 each 3  . Blood Glucose Monitoring Suppl (BAYER CONTOUR NEXT MONITOR) w/Device KIT Use to check blood sugar 1 kit 0  . glucose blood (BAYER CONTOUR NEXT TEST) test strip Check blood sugar three times daily. 300 each 12  . ibuprofen (ADVIL,MOTRIN) 600 MG tablet Take 1 tablet (600  mg total) by mouth every 6 (six) hours. (Patient taking differently: Take 600 mg by mouth as needed. ) 30 tablet 0  . Insulin Glargine (TOUJEO SOLOSTAR) 300 UNIT/ML SOPN Inject 18 Units into the skin daily. 3 pen 5  . Insulin Pen Needle 32G X 4 MM MISC Use to inject insulin once daily. 90 each 3  . levonorgestrel (MIRENA) 20 MCG/24HR IUD 1 each by Intrauterine route once.    . metFORMIN (GLUCOPHAGE-XR) 500 MG 24 hr tablet Take 2 tablets (1,000 mg total) by mouth 2 (two) times daily with a meal. 120 tablet 5   No current facility-administered medications on file prior to visit.    Allergies  Allergen Reactions  . Shellfish Allergy Itching and Swelling  . Shrimp [Shellfish Allergy] Shortness Of Breath    "Feels like my throat is closing up".   Family History  Problem Relation Age of Onset  . Hypertension Other   . Hypertension Mother   . Hypertension Father   . Hypertension Maternal Grandmother   . Hypertension Maternal Grandfather   . Hypertension Paternal Grandmother   . Hypertension Paternal Grandfather    She also has a history of hyperlipidemia in mother. Also see HPI.  PE: BP 104/64   Pulse 82   Ht 5' 2"  (1.575 m)   Wt 118 lb 12.8 oz (53.9 kg)   SpO2 99%   BMI 21.73 kg/m  Body mass index is 21.73 kg/m.  Wt Readings from Last 3 Encounters:  08/15/17 118 lb 12.8 oz (53.9 kg)  11/02/16 123 lb 9.6 oz (56.1 kg)  07/07/16 124 lb (56.2 kg)   Constitutional: Normal weight, in NAD Eyes: PERRLA, EOMI, no exophthalmos ENT: moist mucous membranes, no thyromegaly, no cervical lymphadenopathy Cardiovascular: RRR, No MRG Respiratory: CTA B Gastrointestinal: abdomen soft, NT, ND, BS+ Musculoskeletal: no deformities, strength intact in all 4 Skin: moist, warm, no rashes Neurological: no tremor with outstretched hands, DTR normal in all 4  ASSESSMENT: 1.  Autoimmune DM, insulin-dependent  Component     Latest Ref Rng & Units 07/07/2016  C-Peptide     0.80 - 3.85 ng/mL 3.17   Glucose, Fasting     65 - 99 mg/dL 177 (H)  Glutamic Acid Decarb Ab     <5 IU/mL >250 (H)  Pancreatic Islet Cell Antibody     <5 JDF Units <5   2. HL  PLAN:  1. Patient with history of gestational diabetes in 2016, and then with diagnosis of diabetes, noncompliant with appointments, returning after another long absence, of 13 months.  At last visit sugars were higher after stopping metformin due to GI intolerance (nausea/vomiting).  We changed formulation to metformin ER and planned to start Januvia if she could not tolerate this.  HbA1c at last visit was 8.3%, however, in 10/2016, at last check,  this was lower, at 7.0% - Of note, she had high GAD antibodies at last visit, pointing towards autoimmune diabetes.  At last visits check, she had good insulin production based on the C-peptide - At this visit, her sugars are much worse, 200-300s, and she is glucose toxic.  She lost 5 pounds since 10/2016 and she has fatigue, blurry vision, headaches, shortness of breath with exertion, and joint pain. 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  We discussed about how the mealtime insulin works and also how to use the [employer] and I gave her a coupon card for this.  For now, we can continue the same dose of basal insulin.  I advised her to contact me through my chart to let me know how her sugars are doing - I suggested to:  Patient Instructions  Please continue: - Toujeo 20 units at bedtime  Please start Novolog 10-15 min before each meals: - 4 units before a smaller meal - 6 units before a larger meal  Please return in 1.5 months with your sugar log.   - today, HbA1c is 11.2% (MUCH worse) - continue checking sugars at different times of the day - check 1-2x a day, rotating checks - Return to clinic in 1.5 mo with sugar log    2. HL -Reviewed lipid  panel from last visit: LDL at goal, however, slightly low HDL.  Triglycerides excellent. -We can continue without statins for now  Philemon Kingdom, MD PhD [facility] Endocrinology

## 2017-08-15 NOTE — Patient Instructions (Addendum)
Please continue: - Toujeo 20 units at bedtime  Please start Novolog 10-15 min before each meals: - 4 units before a smaller meal - 6 units before a larger meal  Please return in 1.5 months with your sugar log.

## 2017-09-08 ENCOUNTER — Ambulatory Visit: Payer: Self-pay | Admitting: Internal Medicine

## 2017-10-02 ENCOUNTER — Encounter: Payer: Self-pay | Admitting: Internal Medicine

## 2017-10-02 ENCOUNTER — Ambulatory Visit: Payer: BLUE CROSS/BLUE SHIELD | Admitting: Internal Medicine

## 2017-10-02 VITALS — BP 116/74 | HR 87 | Ht 62.0 in | Wt 125.0 lb

## 2017-10-02 DIAGNOSIS — Z794 Long term (current) use of insulin: Secondary | ICD-10-CM | POA: Diagnosis not present

## 2017-10-02 DIAGNOSIS — E785 Hyperlipidemia, unspecified: Secondary | ICD-10-CM

## 2017-10-02 DIAGNOSIS — E119 Type 2 diabetes mellitus without complications: Secondary | ICD-10-CM | POA: Diagnosis not present

## 2017-10-02 DIAGNOSIS — IMO0001 Reserved for inherently not codable concepts without codable children: Secondary | ICD-10-CM

## 2017-10-02 LAB — COMPLETE METABOLIC PANEL WITH GFR
AG Ratio: 1.5 (calc) (ref 1.0–2.5)
ALT: 13 U/L (ref 6–29)
AST: 14 U/L (ref 10–30)
Albumin: 4.4 g/dL (ref 3.6–5.1)
Alkaline phosphatase (APISO): 50 U/L (ref 33–115)
BUN: 17 mg/dL (ref 7–25)
CALCIUM: 9.2 mg/dL (ref 8.6–10.2)
CO2: 27 mmol/L (ref 20–32)
CREATININE: 0.68 mg/dL (ref 0.50–1.10)
Chloride: 104 mmol/L (ref 98–110)
GFR, EST NON AFRICAN AMERICAN: 117 mL/min/{1.73_m2} (ref >=60)
GFR, Est African American: 135 mL/min/{1.73_m2} (ref >=60)
GLUCOSE: 153 mg/dL — AB (ref 65–99)
Globulin: 2.9 g/dL (calc) (ref 1.9–3.7)
Potassium: 4.2 mmol/L (ref 3.5–5.3)
Sodium: 137 mmol/L (ref 135–146)
Total Bilirubin: 0.4 mg/dL (ref 0.2–1.2)
Total Protein: 7.3 g/dL (ref 6.1–8.1)

## 2017-10-02 LAB — MICROALBUMIN / CREATININE URINE RATIO
CREATININE, U: 84.5 mg/dL
MICROALB/CREAT RATIO: 2 mg/g (ref 0.0–30.0)
Microalb, Ur: 1.7 mg/dL (ref 0.0–1.9)

## 2017-10-02 LAB — LIPID PANEL
CHOL/HDL RATIO: 3
Cholesterol: 134 mg/dL (ref 0–200)
HDL: 38.9 mg/dL — AB (ref >39.00)
LDL CALC: 78 mg/dL (ref 0–99)
NonHDL: 95.18
TRIGLYCERIDES: 87 mg/dL (ref 0.0–149.0)
VLDL: 17.4 mg/dL (ref 0.0–40.0)

## 2017-10-02 LAB — TSH: TSH: 4.56 u[IU]/mL — ABNORMAL HIGH (ref 0.35–4.50)

## 2017-10-02 MED ORDER — INSULIN ASPART (W/NIACINAMIDE) 100 UNIT/ML ~~LOC~~ SOPN: 4.0000 [IU] | PEN_INJECTOR | Freq: Three times a day (TID) | SUBCUTANEOUS | 5 refills | Status: AC

## 2017-10-02 MED ORDER — INSULIN GLARGINE 300 UNIT/ML ~~LOC~~ SOPN: 22.0000 [IU] | PEN_INJECTOR | Freq: Every day | SUBCUTANEOUS | 5 refills | Status: AC

## 2017-10-02 NOTE — Progress Notes (Signed)
Patient ID: Deanna Nguyen, female   DOB: [birth year], 32 y.o.   MRN: [id number]  HPI: Deanna Nguyen is a 32 y.o.-year-old female, returning for f/u for DM2, dx in 07/2015, previously GDM in 2016, insulin-dependent. Last visit 1.5 mo ago. ObGyn: Dr. Benjie Karvonen.  Reviewed hx: She developed increased thirst and urination, but also blurry vision and HAs . She also mentions that she lost approximately 20 pounds in  few months. She is telling me that she used to drink sodas and juice before, but stopped when she was diagnosed with diabetes. CBG checked at home: HI, then 572.  Last hemoglobin A1c was: Lab Results  Component Value Date   HGBA1C 11.2 08/15/2017   HGBA1C 7.0 (H) 11/02/2016   HGBA1C 8.3 07/07/2016   Pt is on a regimen of: - Toujeo 15 >> 12 >> 18 >> 20 units at bedtime  - Novolog 10-15 min before each meals: - 4 units before a smaller meal (w/ B) - 6 units before a larger meal (w/L and D) She was on Metformin 500 >> metformin ER 1000 mg 2x a day, with meals >> stopped "long time ago" 2/2 nausea.  Pt checks her sugars 1-3x a day: - am:120-160 >> 300s >> 123, 135-180 - 2h after b'fast: 261-399 >> n/c >> 167 - before lunch:  155-180 >> n/c >> 86-122, 151, 219 - 2h after lunch: 335-378 >> n/c >> 67, 88-181, 218 - before dinner: n/c >> 150-200s >> 113-177 (snack) - 2h after dinner: 181-485 >> n/c >> 100-193 - bedtime:  >200 >> 300s >> 63, 75-176, 204 - nighttime: n/c Lowest sugar was 181 >> 56 >> 150 >> 63; she has hypoglycemia awareness in the 70s. Highest sugar was HI >> 189 >> 300s >> 219.  Glucometer: AccuChek (Fast Clix lancets)  Pt's meals are: - Breakfast: Eggs, green bananas and fish, cheese - Lunch: Chicken and vegetables - Dinner: Meat and vegetables - Snacks: No sodas or juices, only water now  - no CKD, last BUN/creatinine:  Lab Results  Component Value Date   BUN 13 11/02/2016   CREATININE 0.89 11/02/2016   - + HL; last set of lipids: Lab Results  Component  Value Date   CHOL 131 07/07/2016   HDL 38.30 (L) 07/07/2016   LDLCALC 81 07/07/2016   TRIG 59.0 07/07/2016   CHOLHDL 3 07/07/2016   - no numbness and tingling in her feet.  - Last eye exam: 07/2016: No DR  ROS: Constitutional: + weight gain/no weight loss, no fatigue, no subjective hyperthermia, no subjective hypothermia Eyes: no blurry vision, no xerophthalmia ENT: no sore throat, no nodules palpated in throat, no dysphagia, no odynophagia, no hoarseness Cardiovascular: + CP when stressed - prev. Investigation neg ~ 1 year ago by PCP/no SOB/no palpitations/no leg swelling Respiratory: no cough/no SOB/no wheezing Gastrointestinal: no N/no V/no D/no C/no acid reflux Musculoskeletal: no muscle aches/no joint aches Skin: no rashes, no hair loss Neurological: no tremors/no numbness/no tingling/no dizziness  I reviewed pt's medications, allergies, PMH, social hx, family hx, and changes were documented in the history of present illness. Otherwise, unchanged from my initial visit note.  Past Medical History:  Diagnosis Date  . Gestational diabetes   . Gestational diabetes mellitus, antepartum    Gestational, with current pregnancy only  . Headache    Past Surgical History:  Procedure Laterality Date  . NO PAST SURGERIES     Social History   Social History  . Marital Status: Married    Spouse  Name: Alroy Dust  . Number of Children: 2  . Years of Education: MA   Occupational History  .  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 Smoking status: Never Smoker   . Smokeless tobacco: Never Used  . Alcohol Use: No  . Drug Use: No   Current Outpatient Medications on File Prior to Visit  Medication Sig Dispense Refill  . BAYER MICROLET LANCETS lancets Check blood sugar three times daily 300 each 12  . Blood Glucose Monitoring Suppl (BAYER CONTOUR NEXT MONITOR) w/Device KIT Use to check blood sugar 1 kit 0  . glucose blood (BAYER CONTOUR NEXT TEST) test strip  Check blood sugar three times daily. 300 each 12  . ibuprofen (ADVIL,MOTRIN) 600 MG tablet Take 1 tablet (600 mg total) by mouth every 6 (six) hours. (Patient taking differently: Take 600 mg by mouth as needed. ) 30 tablet 0  . insulin aspart (NOVOLOG FLEXPEN) 100 UNIT/ML FlexPen Inject 4-6 Units into the skin 3 (three) times daily with meals. 15 mL 5  . Insulin Glargine (TOUJEO SOLOSTAR) 300 UNIT/ML SOPN Inject 20 Units into the skin daily. 5 pen 5  . Insulin Pen Needle 32G X 4 MM MISC Use to inject insulin 4x daily. 200 each 5  . levonorgestrel (MIRENA) 20 MCG/24HR IUD 1 each by Intrauterine route once.     No current facility-administered medications on file prior to visit.    Allergies  Allergen Reactions  . Shellfish Allergy Itching and Swelling  . Shrimp [Shellfish Allergy] Shortness Of Breath    "Feels like my throat is closing up".   Family History  Problem Relation Age of Onset  . Hypertension Other   . Hypertension Mother   . Hypertension Father   . Hypertension Maternal Grandmother   . Hypertension Maternal Grandfather   . Hypertension Paternal Grandmother   . Hypertension Paternal Grandfather    She also has a history of hyperlipidemia in mother. Also see HPI.  PE: BP 116/74   Pulse 87   Ht 5' 2"  (1.575 m)   Wt 125 lb (56.7 kg)   SpO2 99%   BMI 22.86 kg/m  Body mass index is 22.86 kg/m.  Wt Readings from Last 3 Encounters:  10/02/17 125 lb (56.7 kg)  08/15/17 118 lb 12.8 oz (53.9 kg)  11/02/16 123 lb 9.6 oz (56.1 kg)   Constitutional: normal weight - central adiposity, in NAD Eyes: PERRLA, EOMI, no exophthalmos ENT: moist mucous membranes, no thyromegaly, no cervical lymphadenopathy Cardiovascular: RRR, No MRG Respiratory: CTA B Gastrointestinal: abdomen soft, NT, ND, BS+ Musculoskeletal: no deformities, strength intact in all 4 Skin: moist, warm, no rashes Neurological: no tremor with outstretched hands, DTR normal in all 4  ASSESSMENT: 1.   Autoimmune DM, insulin-dependent  Component     Latest Ref Rng & Units 07/07/2016  C-Peptide     0.80 - 3.85 ng/mL 3.17  Glucose, Fasting     65 - 99 mg/dL 177 (H)  Glutamic Acid Decarb Ab     <5 IU/mL >250 (H)  Pancreatic Islet Cell Antibody     <5 JDF Units <5   2. HL  PLAN:  1. Patient with history of gestational diabetes in 2016 and then diagnosed with diabetes, noncompliant with appointments  in the past, now returns after 1.5 months.  At last visit, sugars were much higher, and HbA1c was 11.2%.  She had positive GAD antibodies, but good C-peptide.  She was  only on Toujeo at that time and we added NovoLog  before meals. - sugars are much better after starting NovoLog but still high in am >> increase Toujeo by 10% - also, to help her with the Novolog inj's I suggested to switch to FiAsp which can be injected right before a meal. I gave her a coupon for this at last visit - I suggested to:  Patient Instructions  Please increase: - Toujeo to 22 units at bedtime  Switch: - FiAsp 4-6 units before meals (at the beginning of the meal)  Please return in 3-4 months with your sugar log.    - continue checking sugars at different times of the day - check 3x a day, rotating checks - advised for yearly eye exams >> she is UTD - will check annual labs today - Return to clinic in 3 mo with sugar log    2. HL - Reviewed latest lipid panel from 06/2016: LDL at goal, HDL slightly  low Lab Results  Component Value Date   CHOL 131 07/07/2016   HDL 38.30 (L) 07/07/2016   LDLCALC 81 07/07/2016   TRIG 59.0 07/07/2016   CHOLHDL 3 07/07/2016  - not on statins - needs a new Lipid panel today  Component     Latest Ref Rng & Units 10/02/2017  Glucose     65 - 99 mg/dL 153 (H)  BUN     7 - 25 mg/dL 17  Creatinine     0.50 - 1.10 mg/dL 0.68  GFR, Est Non African American     > OR = 60 mL/min/1.20m 117  GFR, Est African American     > OR = 60 mL/min/1.774m135  BUN/Creatinine Ratio      6 - 22 (calc) NOT APPLICABLE  Sodium     13573 146 mmol/L 137  Potassium     3.5 - 5.3 mmol/L 4.2  Chloride     98 - 110 mmol/L 104  CO2     20 - 32 mmol/L 27  Calcium     8.6 - 10.2 mg/dL 9.2  Total Protein     6.1 - 8.1 g/dL 7.3  Albumin MSPROF     3.6 - 5.1 g/dL 4.4  Globulin     1.9 - 3.7 g/dL (calc) 2.9  AG Ratio     1.0 - 2.5 (calc) 1.5  Total Bilirubin     0.2 - 1.2 mg/dL 0.4  Alkaline phosphatase (APISO)     33 - 115 U/L 50  AST     10 - 30 U/L 14  ALT     6 - 29 U/L 13  Cholesterol     0 - 200 mg/dL 134  Triglycerides     0.0 - 149.0 mg/dL 87.0  HDL Cholesterol     >39.00 mg/dL 38.90 (L)  VLDL     0.0 - 40.0 mg/dL 17.4  LDL (calc)     0 - 99 mg/dL 78  Total CHOL/HDL Ratio      3  NonHDL      95.18  Microalb, Ur     0.0 - 1.9 mg/dL 1.7  Creatinine,U     mg/dL 84.5  MICROALB/CREAT RATIO     0.0 - 30.0 mg/g 2.0  TSH     0.35 - 4.50 uIU/mL 4.56 (H)   Lipids at goal, with the exception of slightly high HDL. TSH slightly high.  At next visit, we will check a TSH, free T4, free  T3 and also add antithyroid antibodies. The rest of the labs are normal except high glucose.  Philemon Kingdom, MD PhD [facility] Endocrinology

## 2017-10-02 NOTE — Patient Instructions (Addendum)
Please increase: - Toujeo to 22 units at bedtime  Switch: - FiAsp 4-6 units before meals (at the beginning of the meal)  Please return in 3-4 months with your sugar log.

## 2018-02-06 ENCOUNTER — Encounter: Payer: Self-pay | Admitting: Internal Medicine

## 2018-02-06 ENCOUNTER — Ambulatory Visit: Payer: BLUE CROSS/BLUE SHIELD | Admitting: Internal Medicine

## 2018-02-06 VITALS — BP 110/80 | HR 83 | Ht 62.0 in | Wt 129.0 lb

## 2018-02-06 DIAGNOSIS — Z794 Long term (current) use of insulin: Secondary | ICD-10-CM

## 2018-02-06 DIAGNOSIS — R7989 Other specified abnormal findings of blood chemistry: Secondary | ICD-10-CM

## 2018-02-06 DIAGNOSIS — E119 Type 2 diabetes mellitus without complications: Secondary | ICD-10-CM

## 2018-02-06 DIAGNOSIS — Z23 Encounter for immunization: Secondary | ICD-10-CM | POA: Diagnosis not present

## 2018-02-06 DIAGNOSIS — IMO0001 Reserved for inherently not codable concepts without codable children: Secondary | ICD-10-CM

## 2018-02-06 DIAGNOSIS — E785 Hyperlipidemia, unspecified: Secondary | ICD-10-CM

## 2018-02-06 LAB — POCT GLYCOSYLATED HEMOGLOBIN (HGB A1C): HEMOGLOBIN A1C: 6.5 % — AB (ref 4.0–5.6)

## 2018-02-06 MED ORDER — FREESTYLE LIBRE 14 DAY SENSOR MISC: 1.0000 | 11 refills | Status: AC

## 2018-02-06 MED ORDER — FREESTYLE LIBRE 14 DAY READER DEVI: 1.0000 | Freq: Once | 1 refills | Status: AC

## 2018-02-06 NOTE — Progress Notes (Signed)
Patient ID: Deanna Nguyen, female   DOB: [birth year], 33 y.o.   MRN: [id number]  HPI: Deanna Nguyen is a 32 y.o.-year-old female, returning for f/u for DM2, dx in 07/2015, previously GDM in 2016, insulin-dependent. Last visit 4 mo ago. ObGyn: Dr. Benjie Karvonen.  Reviewed history: She developed increased thirst and urination, but also blurry vision and HAs . She also mentions that she lost approximately 20 pounds in  few months. She is telling me that she used to drink sodas and juice before, but stopped when she was diagnosed with diabetes. CBG checked at home: HI, then 572.  Work-up for type 1 diabetes was positive for GAD antibodies, but she had a very good C-peptide.  Last hemoglobin A1c was: Lab Results  Component Value Date   HGBA1C 11.2 08/15/2017   HGBA1C 7.0 (H) 11/02/2016   HGBA1C 8.3 07/07/2016   Pt is on a regimen of: - Toujeo 15 >> 12 >> 18 >> 20 >> 22 units at bedtime  - Novolog >> Fiasp 4 to 6 units per meal She was on Metformin 500 >> metformin ER 1000 mg 2x a day, with meals >> stopped "long time ago" due to nausea.  Pt checks her sugars 1-3 times a day: - am:120-160 >> 300s >> 123, 135-180 >> 115-120 - 2h after b'fast: 261-399 >> n/c >> 167 >> n/c - before lunch:  155-180 >> n/c >> 86-122, 151, 219 >> 55, 60s if delays meal, 90-110 - 2h after lunch: 335-378 >> n/c >> 67, 88-181, 218 >> n/c  - before dinner: n/c >> 150-200s >> 113-177 (snack) >> 90-110 - 2h after dinner: 181-485 >> n/c >> 100-193 >> n/c - bedtime:  >200 >> 300s >> 63, 75-176, 204 >> n/c - nighttime: n/c Lowest sugar was 63 >> 55; she has hypoglycemia awareness in the 70s.. Highest sugar was 219 >> 220 after pasta.  Glucometer: AccuChek (Fast Clix lancets)  Pt's meals are: - Breakfast: Eggs, green bananas and fish, cheese - Lunch: Chicken and vegetables - Dinner: Meat and vegetables - Snacks: No sodas or juices, only water now  -No CKD, last BUN/creatinine:  Lab Results  Component Value Date   BUN  17 10/02/2017   CREATININE 0.68 10/02/2017   - + HL; last set of lipids: Lab Results  Component Value Date   CHOL 134 10/02/2017   HDL 38.90 (L) 10/02/2017   LDLCALC 78 10/02/2017   TRIG 87.0 10/02/2017   CHOLHDL 3 10/02/2017  Not on a statin.  - No numbness and tingling in her feet.  - Last eye exam: 07/2017: No DR  She had chest pain with anxiety before and this was investigated in the past with negative cardiac work-up.  At last visit, she was found to have an elevated TSH during regular screening: Lab Results  Component Value Date   TSH 4.56 (H) 10/02/2017    ROS: Constitutional: + weight gain/no weight loss, + fatigue, no subjective hyperthermia, no subjective hypothermia Eyes: + Blurry vision, no xerophthalmia ENT: no sore throat, no nodules palpated in throat, no dysphagia, no odynophagia, no hoarseness Cardiovascular: + CP/no SOB/+ palpitations/no leg swelling Respiratory: no cough/no SOB/no wheezing Gastrointestinal: no N/no V/no D/no C/no acid reflux Musculoskeletal: no muscle aches/no joint aches Skin: no rashes, no hair loss Neurological: no tremors/no numbness/no tingling/no dizziness, + headaches with hyper/hypoglycemia  I reviewed pt's medications, allergies, PMH, social hx, family hx, and changes were documented in the history of present illness. Otherwise, unchanged from my initial visit note.  Past Medical History:  Diagnosis Date  . Gestational diabetes   . Gestational diabetes mellitus, antepartum    Gestational, with current pregnancy only  . Headache    Past Surgical History:  Procedure Laterality Date  . NO PAST SURGERIES     Social History   Social History  . Marital Status: Married    Spouse Name: Alroy Dust  . Number of Children: 2  . Years of Education: MA   Occupational History  .  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 Smoking status: Never Smoker   . Smokeless tobacco: Never Used  . Alcohol Use: No  . Drug  Use: No   Current Outpatient Medications on File Prior to Visit  Medication Sig Dispense Refill  . BAYER MICROLET LANCETS lancets Check blood sugar three times daily 300 each 12  . Blood Glucose Monitoring Suppl (BAYER CONTOUR NEXT MONITOR) w/Device KIT Use to check blood sugar 1 kit 0  . glucose blood (BAYER CONTOUR NEXT TEST) test strip Check blood sugar three times daily. 300 each 12  . ibuprofen (ADVIL,MOTRIN) 600 MG tablet Take 1 tablet (600 mg total) by mouth every 6 (six) hours. (Patient taking differently: Take 600 mg by mouth as needed. ) 30 tablet 0  . Insulin Aspart, w/Niacinamide, (FIASP FLEXTOUCH) 100 UNIT/ML SOPN Inject 4-6 Units into the skin 3 (three) times daily before meals. 5 pen 5  . Insulin Glargine (TOUJEO SOLOSTAR) 300 UNIT/ML SOPN Inject 22 Units into the skin daily. 5 pen 5  . Insulin Pen Needle 32G X 4 MM MISC Use to inject insulin 4x daily. 200 each 5  . levonorgestrel (MIRENA) 20 MCG/24HR IUD 1 each by Intrauterine route once.     No current facility-administered medications on file prior to visit.    Allergies  Allergen Reactions  . Shellfish Allergy Itching and Swelling  . Shrimp [Shellfish Allergy] Shortness Of Breath    "Feels like my throat is closing up".   Family History  Problem Relation Age of Onset  . Hypertension Other   . Hypertension Mother   . Hypertension Father   . Hypertension Maternal Grandmother   . Hypertension Maternal Grandfather   . Hypertension Paternal Grandmother   . Hypertension Paternal Grandfather    She also has a history of hyperlipidemia in mother. Also see HPI.  PE: BP 110/80   Pulse 83   Ht 5' 2"  (1.575 m)   Wt 129 lb (58.5 kg)   SpO2 99%   BMI 23.59 kg/m  Body mass index is 23.59 kg/m.  Wt Readings from Last 3 Encounters:  02/06/18 129 lb (58.5 kg)  10/02/17 125 lb (56.7 kg)  08/15/17 118 lb 12.8 oz (53.9 kg)   Constitutional: Normal weight, in NAD Eyes: PERRLA, EOMI, no exophthalmos ENT: moist mucous  membranes, no thyromegaly, no cervical lymphadenopathy Cardiovascular: RRR, No MRG Respiratory: CTA B Gastrointestinal: abdomen soft, NT, ND, BS+ Musculoskeletal: no deformities, strength intact in all 4 Skin: moist, warm, no rashes Neurological: no tremor with outstretched hands, DTR normal in all 4  ASSESSMENT: 1.  Autoimmune DM, insulin-dependent  Component     Latest Ref Rng & Units 07/07/2016  C-Peptide     0.80 - 3.85 ng/mL 3.17  Glucose, Fasting     65 - 99 mg/dL 177 (H)  Glutamic Acid Decarb Ab     <5 IU/mL >250 (H)  Pancreatic Islet Cell Antibody     <5 JDF Units <  5   2. HL  PLAN:  1. Patient with history of gestational diabetes in 2016 and then diagnosed with diabetes.  She was noncompliant with appointment in the past, but she is now doing well with this.  Before last visit, sugars are much higher, and HbA1c returned at 11.2%.  At that time, we found a positive GAD antibodies, but with a good C-peptide.  We added NovoLog at that time and continued to show.  At last visit sugars were much better after addition of NovoLog, but still high in the morning so we increase Toujeo by 10%.  I also advised her to switch to [place] and gave her a coupon for this.  I advised her that this insulin is injecting at the start of the meal, rather than 15 minutes before as for NovoLog.  She was able to start [facility] and she likes this more than NovoLog. -At this visit, her sugars are significantly improved on the current regimen, without significant low blood sugars except for occasional lower sugars in the 60s if she delays a meal.  She also has occasional hyperglycemic spikes after a meal that contains more carbs, for example pasta.  Her highest sugar was 220.  This only happened once.  Otherwise, her sugars may increase to 180-190 after larger meals. -We discussed at this visit that we need to think about carb counting in the near future so that we can tailor the insulin doses based on the number of  carbs that she eats.  For now, I would like to let her get more used to the basal-bolus insulin regimen especially since her sugars are mostly at goal and significantly improved since last visit. -She tells me she has blurry vision and we discussed that sometimes after improvement of her blood sugars she may need to change her glasses.  She will schedule an appointment with her eye doctor sooner than a year from the prior. - I suggested to:  Patient Instructions  Please decrease: - Toujeo to 20 units at night for the next 3 days, then try to decrease to 18 units  Please continue: - FiAsp 4-6 units before meals  Please stop at the lab.  Please return in 4 months with your sugar log.   - today, HbA1c is 6.5% (MUCH better) - continue checking sugars at different times of the day - check 3x a day, rotating checks - advised for yearly eye exams >> she is not UTD - will give her the flu shot today - Return to clinic in 3-4 mo with sugar log   2. HL - Reviewed latest lipid panel from 09/2017: LDL at goal, HDL slightly low Lab Results  Component Value Date   CHOL 134 10/02/2017   HDL 38.90 (L) 10/02/2017   LDLCALC 78 10/02/2017   TRIG 87.0 10/02/2017   CHOLHDL 3 10/02/2017  -She is not on a statin  3.  Elevated TSH -TSH was slightly high at last visit -At this visit, we will recheck the TSH, but also add free T4, free T3 and TPO and ATA antibodies.  Component     Latest Ref Rng & Units 02/06/2018  Hemoglobin A1C     4.0 - 5.6 % 6.5 (A)  TSH     0.35 - 4.50 uIU/mL 6.31 (H)  T4,Free(Direct)     0.60 - 1.60 ng/dL 0.67  Triiodothyronine,Free,Serum     2.3 - 4.2 pg/mL 3.1  Thyroperoxidase Ab SerPl-aCnc     <9 IU/mL 318 (H)  Thyroglobulin Ab     < or = 1 IU/mL 1  New dx of Hashimoto's Thyroiditis  TSH slightly higher but no absolute need to start LT4 for now. Will recheck at next visit and start LT4 then if TSH continues to increase.  Philemon Kingdom, MD PhD [facility]  Endocrinology

## 2018-02-06 NOTE — Patient Instructions (Addendum)
Please decrease: - Toujeo to 20 units at night for the next 3 days, then try to decrease to 18 units  Please continue: - FiAsp 4-6 units before meals  Please stop at the lab.  Please return in 4 months with your sugar log.

## 2018-02-07 LAB — T4, FREE: Free T4: 0.67 ng/dL (ref 0.60–1.60)

## 2018-02-07 LAB — TSH: TSH: 6.31 u[IU]/mL — ABNORMAL HIGH (ref 0.35–4.50)

## 2018-02-07 LAB — THYROID PEROXIDASE ANTIBODY: Thyroperoxidase Ab SerPl-aCnc: 318 IU/mL — ABNORMAL HIGH (ref <9)

## 2018-02-07 LAB — T3, FREE: T3, Free: 3.1 pg/mL (ref 2.3–4.2)

## 2018-02-07 LAB — THYROGLOBULIN ANTIBODY: THYROGLOBULIN AB: 1 [IU]/mL (ref <=1)

## 2018-02-10 IMAGING — DX DG CHEST 2V
2 series · 2 of 2 positions shown · non-contrast
Comparison: None.

CLINICAL DATA: Chest pain and shortness of Breath

EXAM:
CHEST  2 VIEW

[chest pa]
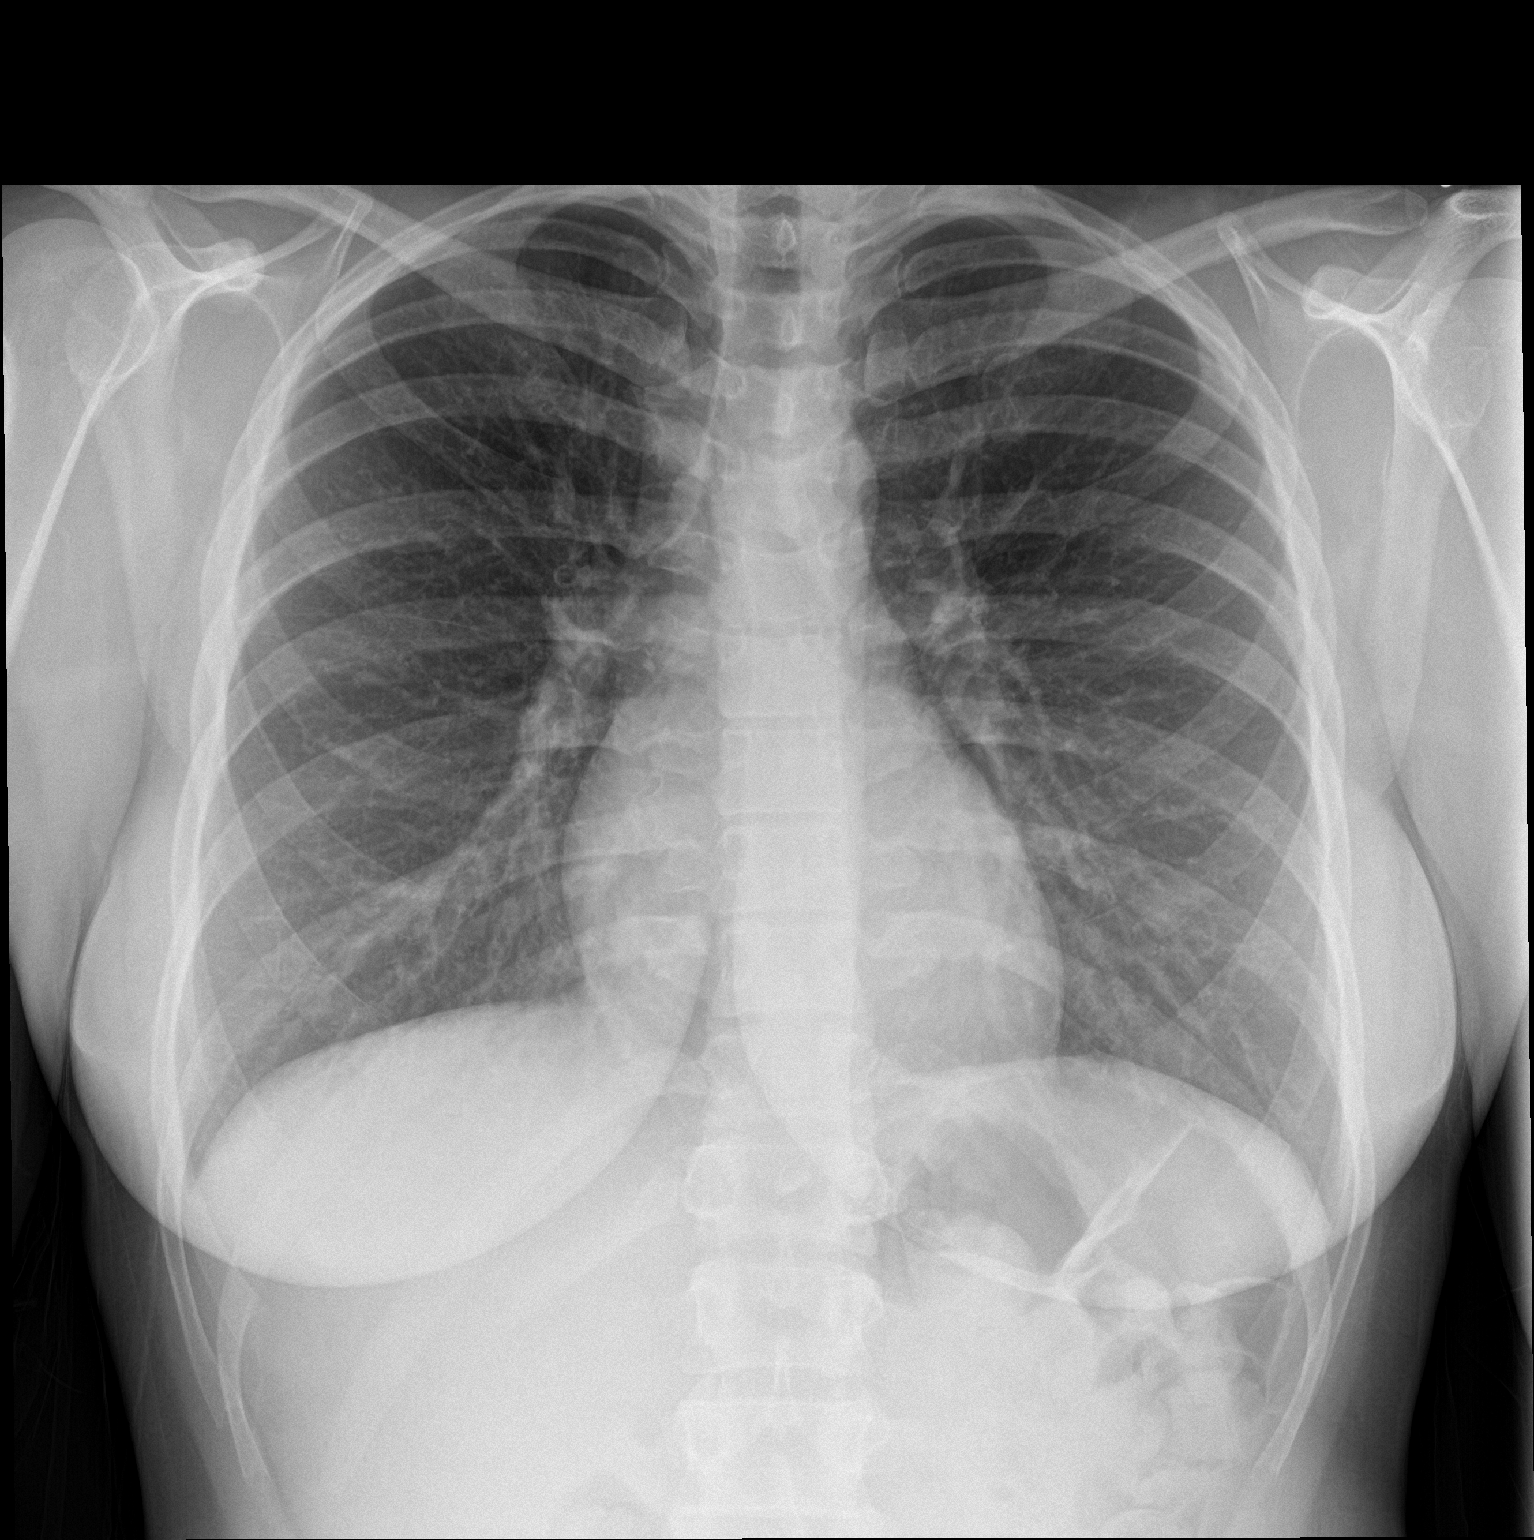

[chest lat]
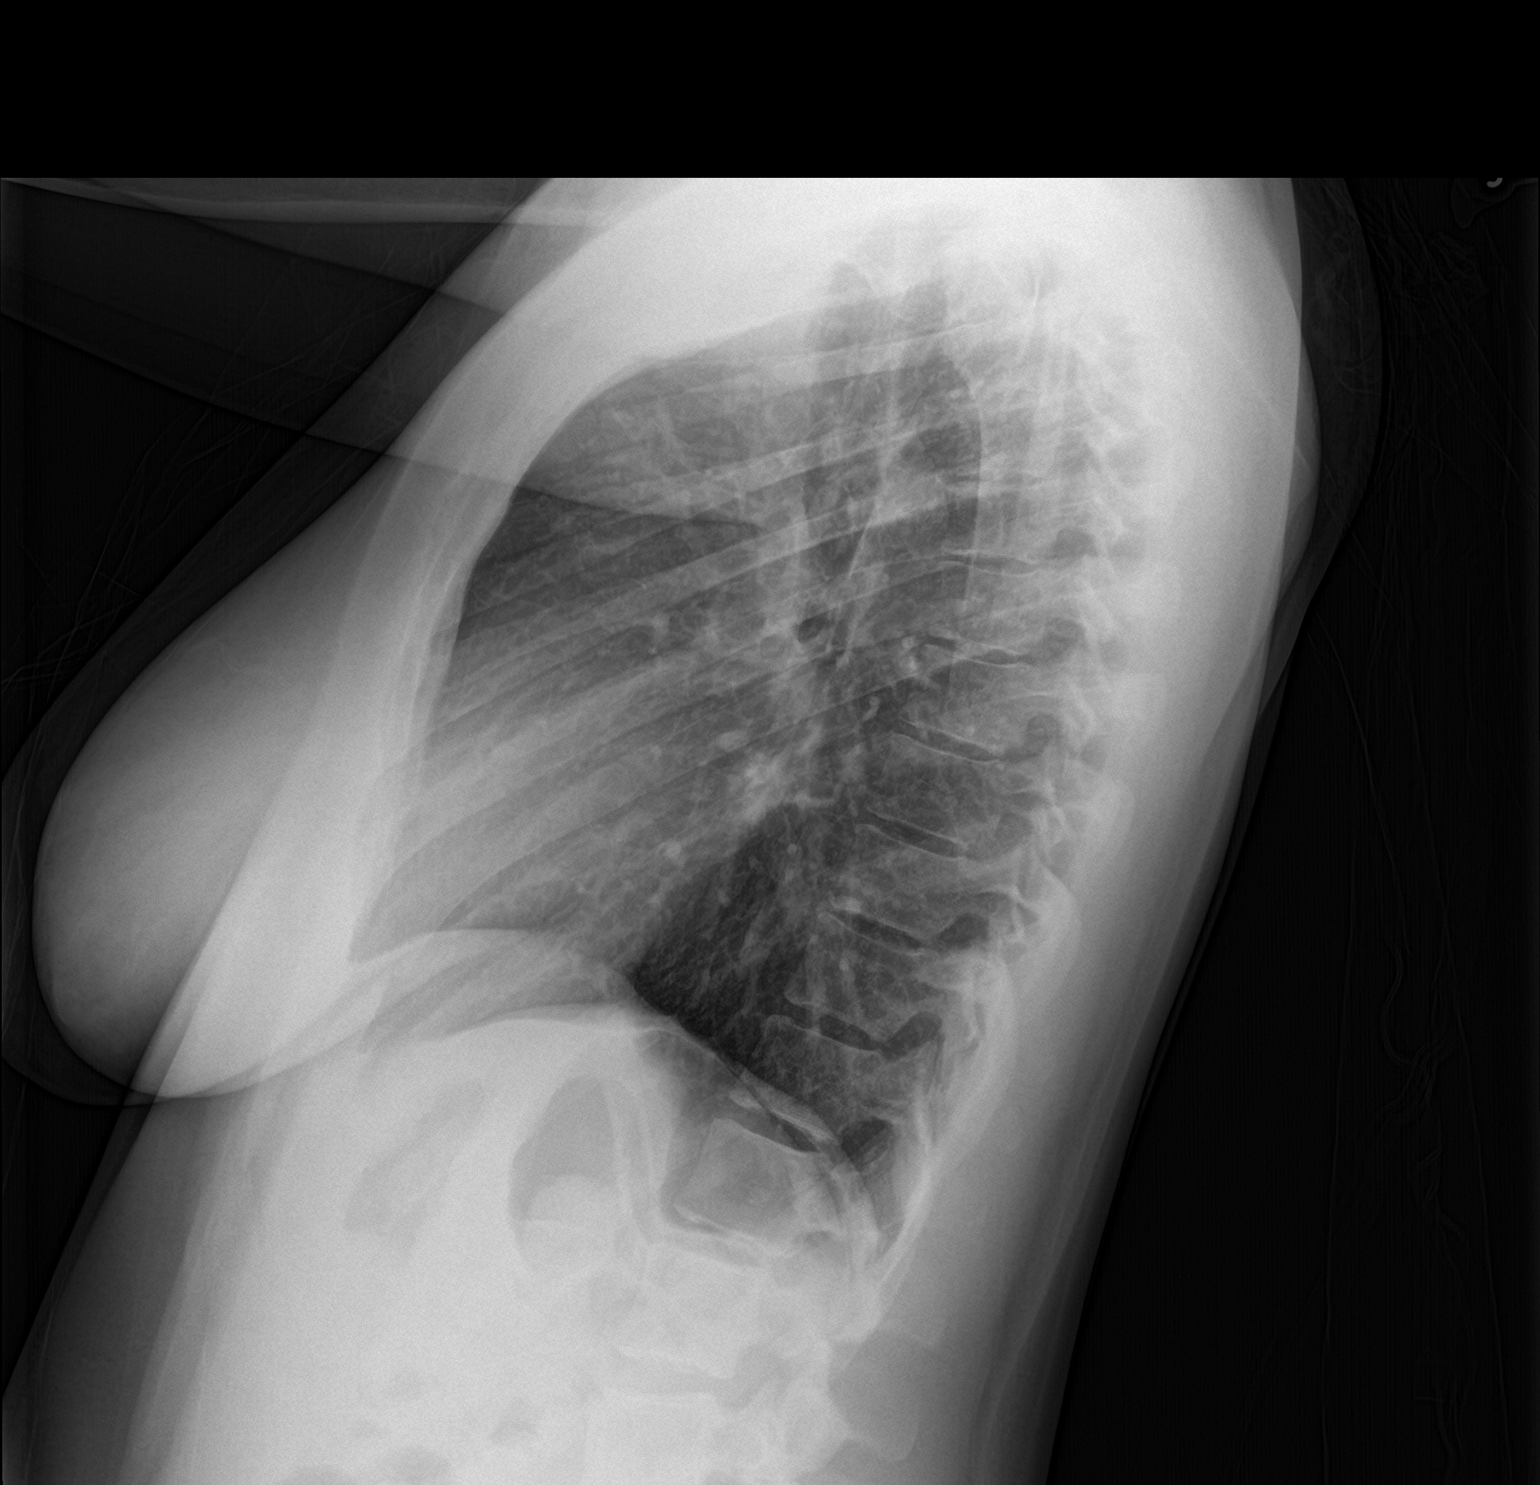

[2 of 2 positions shown; findings below may reference images not displayed]

FINDINGS: The heart size and mediastinal contours are within normal limits.
Both lungs are clear. The visualized skeletal structures are
unremarkable.
IMPRESSION: No active cardiopulmonary disease.

## 2018-02-14 ENCOUNTER — Encounter: Payer: Self-pay | Admitting: Internal Medicine

## 2018-06-11 ENCOUNTER — Ambulatory Visit: Payer: BLUE CROSS/BLUE SHIELD | Admitting: Internal Medicine

## 2018-06-12 ENCOUNTER — Encounter: Payer: Self-pay | Admitting: Internal Medicine

## 2018-06-13 MED ORDER — BAYER MICROLET LANCETS MISC: 12 refills | Status: AC

## 2018-06-27 ENCOUNTER — Encounter: Payer: Self-pay | Admitting: Internal Medicine

## 2018-06-29 MED ORDER — GLUCOSE BLOOD VI STRP: ORAL_STRIP | 12 refills | Status: AC

## 2018-06-29 MED ORDER — INSULIN PEN NEEDLE 32G X 4 MM MISC: 5 refills | Status: AC

## 2018-08-22 ENCOUNTER — Other Ambulatory Visit: Payer: Self-pay | Admitting: Internal Medicine

## 2018-08-22 DIAGNOSIS — Z794 Long term (current) use of insulin: Principal | ICD-10-CM

## 2018-08-22 DIAGNOSIS — IMO0001 Reserved for inherently not codable concepts without codable children: Secondary | ICD-10-CM

## 2018-08-22 DIAGNOSIS — E119 Type 2 diabetes mellitus without complications: Principal | ICD-10-CM
